# Patient Record
Sex: Female | Born: 1975 | Race: Black or African American | Hispanic: No | Marital: Single | Smoking: Current every day smoker
Health system: Southern US, Community
[De-identification: ages and names within clinical notes are randomized; demographics above are authoritative.]

## PROBLEM LIST (undated history)

## (undated) DIAGNOSIS — I10 Essential (primary) hypertension: Secondary | ICD-10-CM

---

## 2017-12-14 ENCOUNTER — Other Ambulatory Visit: Payer: Self-pay

## 2017-12-24 ENCOUNTER — Inpatient Hospital Stay (HOSPITAL_COMMUNITY): Payer: Medicaid - Out of State

## 2017-12-24 ENCOUNTER — Encounter (HOSPITAL_COMMUNITY): Payer: Self-pay | Admitting: Emergency Medicine

## 2017-12-24 ENCOUNTER — Emergency Department (HOSPITAL_COMMUNITY): Payer: Medicaid - Out of State

## 2017-12-24 ENCOUNTER — Inpatient Hospital Stay (HOSPITAL_COMMUNITY)
Admission: EM | Admit: 2017-12-24 | Discharge: 2017-12-28 | DRG: 195 | Disposition: A | Discharge status: Home / Self Care | Payer: Medicaid - Out of State | Attending: Family Medicine | Admitting: Family Medicine

## 2017-12-24 ENCOUNTER — Other Ambulatory Visit: Payer: Self-pay

## 2017-12-24 DIAGNOSIS — Z91128 Patient's intentional underdosing of medication regimen for other reason: Secondary | ICD-10-CM | POA: Diagnosis not present

## 2017-12-24 DIAGNOSIS — T50906A Underdosing of unspecified drugs, medicaments and biological substances, initial encounter: Secondary | ICD-10-CM | POA: Diagnosis present

## 2017-12-24 DIAGNOSIS — K5900 Constipation, unspecified: Secondary | ICD-10-CM | POA: Diagnosis present

## 2017-12-24 DIAGNOSIS — F17210 Nicotine dependence, cigarettes, uncomplicated: Secondary | ICD-10-CM | POA: Diagnosis present

## 2017-12-24 DIAGNOSIS — Z8619 Personal history of other infectious and parasitic diseases: Secondary | ICD-10-CM

## 2017-12-24 DIAGNOSIS — R040 Epistaxis: Secondary | ICD-10-CM | POA: Diagnosis present

## 2017-12-24 DIAGNOSIS — R079 Chest pain, unspecified: Secondary | ICD-10-CM | POA: Diagnosis not present

## 2017-12-24 DIAGNOSIS — I10 Essential (primary) hypertension: Secondary | ICD-10-CM | POA: Diagnosis present

## 2017-12-24 DIAGNOSIS — J181 Lobar pneumonia, unspecified organism: Principal | ICD-10-CM

## 2017-12-24 DIAGNOSIS — E876 Hypokalemia: Secondary | ICD-10-CM | POA: Diagnosis present

## 2017-12-24 DIAGNOSIS — I4581 Long QT syndrome: Secondary | ICD-10-CM | POA: Diagnosis present

## 2017-12-24 DIAGNOSIS — T464X6A Underdosing of angiotensin-converting-enzyme inhibitors, initial encounter: Secondary | ICD-10-CM | POA: Diagnosis present

## 2017-12-24 DIAGNOSIS — J189 Pneumonia, unspecified organism: Secondary | ICD-10-CM

## 2017-12-24 DIAGNOSIS — Z8249 Family history of ischemic heart disease and other diseases of the circulatory system: Secondary | ICD-10-CM

## 2017-12-24 HISTORY — DX: Essential (primary) hypertension: I10

## 2017-12-24 LAB — CBC
HCT: 41.7 % (ref 36.0–46.0)
HCT: 39.3 % (ref 36.0–46.0)
HEMOGLOBIN: 13.8 g/dL (ref 12.0–15.0)
Hemoglobin: 13.2 g/dL (ref 12.0–15.0)
MCH: 27.7 pg (ref 26.0–34.0)
MCH: 28.4 pg (ref 26.0–34.0)
MCHC: 33.1 g/dL (ref 30.0–36.0)
MCHC: 33.6 g/dL (ref 30.0–36.0)
MCV: 83.7 fL (ref 78.0–100.0)
MCV: 84.5 fL (ref 78.0–100.0)
PLATELETS: 223 10*3/uL (ref 150–400)
Platelets: 214 10*3/uL (ref 150–400)
RBC: 4.98 MIL/uL (ref 3.87–5.11)
RBC: 4.65 MIL/uL (ref 3.87–5.11)
RDW: 14.3 % (ref 11.5–15.5)
RDW: 14.6 % (ref 11.5–15.5)
WBC: 11.2 10*3/uL — AB (ref 4.0–10.5)
WBC: 10.4 10*3/uL (ref 4.0–10.5)

## 2017-12-24 LAB — URINALYSIS, ROUTINE W REFLEX MICROSCOPIC
GLUCOSE, UA: NEGATIVE mg/dL
HGB URINE DIPSTICK: NEGATIVE
Ketones, ur: 20 mg/dL — AB
LEUKOCYTES UA: NEGATIVE
NITRITE: NEGATIVE
PH: 5 (ref 5.0–8.0)
Protein, ur: 100 mg/dL — AB
SPECIFIC GRAVITY, URINE: 1.045 — AB (ref 1.005–1.030)

## 2017-12-24 LAB — BASIC METABOLIC PANEL
ANION GAP: 15 (ref 5–15)
BUN: 12 mg/dL (ref 6–20)
CO2: 20 mmol/L — AB (ref 22–32)
CREATININE: 0.9 mg/dL (ref 0.44–1.00)
Calcium: 9.2 mg/dL (ref 8.9–10.3)
Chloride: 101 mmol/L (ref 101–111)
GFR calc non Af Amer: >60 mL/min (ref >60)
Glucose, Bld: 133 mg/dL — ABNORMAL HIGH (ref 65–99)
Potassium: 3.4 mmol/L — ABNORMAL LOW (ref 3.5–5.1)
SODIUM: 136 mmol/L (ref 135–145)

## 2017-12-24 LAB — PROTIME-INR
INR: 1.06
Prothrombin Time: 13.7 seconds (ref 11.4–15.2)

## 2017-12-24 LAB — I-STAT TROPONIN, ED: TROPONIN I, POC: 0 ng/mL (ref 0.00–0.08)

## 2017-12-24 LAB — HEPATIC FUNCTION PANEL
ALT: 17 U/L (ref 14–54)
AST: 25 U/L (ref 15–41)
Albumin: 3.5 g/dL (ref 3.5–5.0)
Alkaline Phosphatase: 80 U/L (ref 38–126)
BILIRUBIN DIRECT: 0.2 mg/dL (ref 0.1–0.5)
Indirect Bilirubin: 0.4 mg/dL (ref 0.3–0.9)
Total Bilirubin: 0.6 mg/dL (ref 0.3–1.2)
Total Protein: 8.5 g/dL — ABNORMAL HIGH (ref 6.5–8.1)

## 2017-12-24 LAB — I-STAT CG4 LACTIC ACID, ED
LACTIC ACID, VENOUS: 1.34 mmol/L (ref 0.5–1.9)
Lactic Acid, Venous: 1.52 mmol/L (ref 0.5–1.9)

## 2017-12-24 LAB — TROPONIN I
Troponin I: <0.03 ng/mL (ref <0.03)
Troponin I: <0.03 ng/mL (ref <0.03)

## 2017-12-24 LAB — LIPASE, BLOOD: LIPASE: 41 U/L (ref 11–51)

## 2017-12-24 LAB — GLUCOSE, CAPILLARY: Glucose-Capillary: 114 mg/dL — ABNORMAL HIGH (ref 65–99)

## 2017-12-24 LAB — PREGNANCY, URINE: Preg Test, Ur: NEGATIVE

## 2017-12-24 LAB — STREP PNEUMONIAE URINARY ANTIGEN: Strep Pneumo Urinary Antigen: NEGATIVE

## 2017-12-24 LAB — APTT: APTT: 37 s — AB (ref 24–36)

## 2017-12-24 MED ORDER — VITAMIN B-1 100 MG PO TABS
100.0000 mg | ORAL_TABLET | Freq: Every day | ORAL | Status: DC
  Administered 2017-12-25 – 2017-12-28 (×4): 100 mg via ORAL
  Filled 2017-12-24 (×4): qty 1

## 2017-12-24 MED ORDER — BENZONATATE 100 MG PO CAPS
100.0000 mg | ORAL_CAPSULE | Freq: Two times a day (BID) | ORAL | Status: DC | PRN
  Administered 2017-12-25 – 2017-12-26 (×4): 100 mg via ORAL
  Filled 2017-12-24 (×4): qty 1

## 2017-12-24 MED ORDER — KETOROLAC TROMETHAMINE 30 MG/ML IJ SOLN: 30.0000 mg | Freq: Once | INTRAMUSCULAR | Status: DC

## 2017-12-24 MED ORDER — TRAMADOL HCL 50 MG PO TABS
50.0000 mg | ORAL_TABLET | Freq: Four times a day (QID) | ORAL | Status: DC | PRN
  Administered 2017-12-24 – 2017-12-28 (×9): 50 mg via ORAL
  Filled 2017-12-24 (×11): qty 1

## 2017-12-24 MED ORDER — FOLIC ACID 1 MG PO TABS
1.0000 mg | ORAL_TABLET | Freq: Every day | ORAL | Status: DC
  Administered 2017-12-25 – 2017-12-28 (×4): 1 mg via ORAL
  Filled 2017-12-24 (×4): qty 1

## 2017-12-24 MED ORDER — SODIUM CHLORIDE 0.9 % IV BOLUS
2000.0000 mL | Freq: Once | INTRAVENOUS | Status: AC
Start: 2017-12-24 — End: 2017-12-24
  Administered 2017-12-24: 2000 mL via INTRAVENOUS

## 2017-12-24 MED ORDER — SODIUM CHLORIDE 0.9 % IV SOLN: INTRAVENOUS | Status: DC

## 2017-12-24 MED ORDER — ACETAMINOPHEN 325 MG PO TABS
650.0000 mg | ORAL_TABLET | Freq: Four times a day (QID) | ORAL | Status: DC | PRN
Start: 2017-12-24 — End: 2017-12-28
  Administered 2017-12-24 – 2017-12-26 (×2): 650 mg via ORAL
  Filled 2017-12-24 (×2): qty 2

## 2017-12-24 MED ORDER — GI COCKTAIL ~~LOC~~
30.0000 mL | Freq: Once | ORAL | Status: AC
  Administered 2017-12-24: 30 mL via ORAL
  Filled 2017-12-24: qty 30

## 2017-12-24 MED ORDER — MORPHINE SULFATE (PF) 4 MG/ML IV SOLN
1.0000 mg | Freq: Once | INTRAVENOUS | Status: AC
  Administered 2017-12-24: 1 mg via INTRAVENOUS
  Filled 2017-12-24: qty 1

## 2017-12-24 MED ORDER — IOPAMIDOL (ISOVUE-300) INJECTION 61%: 100.0000 mL | Freq: Once | INTRAVENOUS | Status: DC | PRN

## 2017-12-24 MED ORDER — ONDANSETRON HCL 40 MG/20ML IJ SOLN
8.0000 mg | Freq: Once | INTRAMUSCULAR | Status: DC
  Filled 2017-12-24: qty 4

## 2017-12-24 MED ORDER — POLYETHYLENE GLYCOL 3350 17 G PO PACK: 17.0000 g | PACK | Freq: Every day | ORAL | Status: DC | PRN

## 2017-12-24 MED ORDER — ACETAMINOPHEN 325 MG PO TABS
650.0000 mg | ORAL_TABLET | Freq: Four times a day (QID) | ORAL | Status: DC | PRN
  Administered 2017-12-24: 650 mg via ORAL
  Filled 2017-12-24: qty 2

## 2017-12-24 MED ORDER — SODIUM CHLORIDE 0.9 % IV SOLN
INTRAVENOUS | Status: DC
  Administered 2017-12-24 – 2017-12-26 (×6): via INTRAVENOUS

## 2017-12-24 MED ORDER — SODIUM CHLORIDE 0.9 % IV SOLN
8.0000 mg | Freq: Three times a day (TID) | INTRAVENOUS | Status: DC
  Filled 2017-12-24: qty 4

## 2017-12-24 MED ORDER — SODIUM CHLORIDE 0.9 % IV SOLN
2.0000 g | INTRAVENOUS | Status: DC
  Administered 2017-12-24 – 2017-12-26 (×3): 2 g via INTRAVENOUS
  Filled 2017-12-24 (×4): qty 20

## 2017-12-24 MED ORDER — IOPAMIDOL (ISOVUE-370) INJECTION 76%
INTRAVENOUS | Status: AC
  Filled 2017-12-24: qty 100

## 2017-12-24 MED ORDER — IOPAMIDOL (ISOVUE-370) INJECTION 76%
100.0000 mL | Freq: Once | INTRAVENOUS | Status: AC | PRN
  Administered 2017-12-24: 100 mL via INTRAVENOUS

## 2017-12-24 MED ORDER — NICOTINE 7 MG/24HR TD PT24
7.0000 mg | MEDICATED_PATCH | Freq: Every day | TRANSDERMAL | Status: DC
  Administered 2017-12-24 – 2017-12-28 (×5): 7 mg via TRANSDERMAL
  Filled 2017-12-24 (×5): qty 1

## 2017-12-24 MED ORDER — PROMETHAZINE HCL 25 MG PO TABS
25.0000 mg | ORAL_TABLET | Freq: Four times a day (QID) | ORAL | Status: DC | PRN
Start: 2017-12-24 — End: 2017-12-28

## 2017-12-24 MED ORDER — ENOXAPARIN SODIUM 40 MG/0.4ML ~~LOC~~ SOLN
40.0000 mg | SUBCUTANEOUS | Status: DC
  Filled 2017-12-24: qty 0.4

## 2017-12-24 MED ORDER — NITROGLYCERIN 0.4 MG SL SUBL
0.4000 mg | SUBLINGUAL_TABLET | SUBLINGUAL | Status: DC | PRN
  Administered 2017-12-24: 0.4 mg via SUBLINGUAL
  Filled 2017-12-24: qty 1

## 2017-12-24 MED ORDER — HYDRALAZINE HCL 20 MG/ML IJ SOLN: 2.0000 mg | INTRAMUSCULAR | Status: DC | PRN

## 2017-12-24 MED ORDER — ALBUTEROL SULFATE (2.5 MG/3ML) 0.083% IN NEBU
2.5000 mg | INHALATION_SOLUTION | RESPIRATORY_TRACT | Status: DC | PRN
  Administered 2017-12-24 – 2017-12-25 (×2): 2.5 mg via RESPIRATORY_TRACT
  Filled 2017-12-24 (×2): qty 3

## 2017-12-24 MED ORDER — IBUPROFEN 600 MG PO TABS
600.0000 mg | ORAL_TABLET | Freq: Four times a day (QID) | ORAL | Status: DC | PRN
  Administered 2017-12-25 – 2017-12-28 (×5): 600 mg via ORAL
  Filled 2017-12-24 (×5): qty 1

## 2017-12-24 MED ORDER — ADULT MULTIVITAMIN W/MINERALS CH
1.0000 | ORAL_TABLET | Freq: Every day | ORAL | Status: DC
  Administered 2017-12-25 – 2017-12-28 (×4): 1 via ORAL
  Filled 2017-12-24 (×4): qty 1

## 2017-12-24 MED ORDER — SODIUM CHLORIDE 0.9 % IV SOLN
500.0000 mg | INTRAVENOUS | Status: DC
  Administered 2017-12-24 – 2017-12-26 (×3): 500 mg via INTRAVENOUS
  Filled 2017-12-24 (×4): qty 500

## 2017-12-24 NOTE — ED Notes (Signed)
Pt transported to CT ?

## 2017-12-24 NOTE — Plan of Care (Signed)
  Problem: Health Behavior/Discharge Planning: Goal: Ability to manage health-related needs will improve Outcome: Progressing   Problem: Self-Concept: Goal: Level of anxiety will decrease Outcome: Progressing   Problem: Clinical Measurements: Goal: Will remain free from infection Outcome: Not Progressing   Problem: Health Behavior/Discharge Planning: Goal: Ability to manage health-related needs will improve 12/24/2017 2044 by Larey BrickEjindu, Derril Franek O, RN Outcome: Progressing 12/24/2017 2043 by Larey BrickEjindu, Radonna Bracher O, RN Outcome: Progressing

## 2017-12-24 NOTE — H&P (Addendum)
[facility] History and Physical Service Pager: [phone]  Patient name: Sally Vaughan Medical record number: [id number] Date of birth: 08/07/1976 Age: 42 y.o. Gender: female  Primary Care Provider: No primary care provider on file. Consultants: None Code Status: Full  Chief Complaint: Chest pain and cough  Assessment and Plan: Sally Vaughan is a 42 y.o. female presenting with 3 days of chest pain and cough. PMH is significant for HTN, Syphilis, alcohol use  Chest pain with Cough secondary to RUL/RML/RLL PNA confirmed on CTA chest. 3 days of worsening CP and cough. Febrile 101.14F, tachycardic 130s, tachypnic 30s. Stable on RA. LA wnl. Mild leukocytosis of 11.2 CXR showed RML infiltrate consistent with PNA. EKG Sinus tachycardia, no ST changes. Prolonged QTc 562. Istat troponin was 0.00. Data most consistent with Community Acquired Pneumonia. Also on the differential is ACS, PE given recent travel history. Due to notable discomfort in the ED also considered aortic dissection however PE and dissection were ruled on CTA chest. Patient does not appear fluid overloaded and CXR not supportive of CHF.  -admit to telemetry, attending Dr. Pollie MeyerMcIntyre -cont IV CTX and Azythromycin in ED, narrow and transition to PO when afebrile and tolerating PO - monitor blood cultures - monitor fever curve - AM bmp and cbc - trend troponins - AM EKG - cardiac monitoring - continuous pulse oximetry - avoid NSAIDS until troponin negative x3 in setting of chest pain - tyleonol for pain - Radiology recommends Followup PA and lateral chest X-ray is recommended in 3-4 weeks following trial of antibiotic therapy to ensure resolution and exclude underlying Malignancy. - check LFTs - 1 mg IV morphine for pain x1  Abdominal Pain with Nausea- in the setting of established pneumonia. Most likely due to her acute illness. Patient vomited x2 in room. NBNB. Diffuse abdominal pain Possible  pain related due to PNA. Eyes are icteric and has hyperbilirubinuria. H/o irregular periods, last 1 mo ago.  - urine pregnancy negative - Will get LFTs, lipase to rule out pancreatitis  - GI cocktail  - Phenergan PO q6h prn - avoiding zofran due to prolonged QTc  Epistaxis  Has only experience it in the past day. Reports 6 episodes. No history of bleeding disorders or family history. Has never had this before. Only associated with mild HA. Normal platelets.   - monitor CBC - check PT/INR, PTT  Hypertension Hypertensive on admission 159/109. Has not been on regular BP medication. Pt says she previously took lisinopril, but does not know dose.  - monitor BP - prn hydralazine for > 160/100 - consider discharged on maintenance therapy when tolerating PO better  H/o Syphilis with uvietis Patient reports h/o syphilis with treatment with IV antibiotics. She also reports having the complication of Uvietis. Says her vision is intact. On exam eyes appear icteric with mild blue sclera, no injection or erythema observed.   H/o of alcohol use -CIWA monitoring, no ativan prn  H/o smoking -nicotine patch -nurse to provide smoking cessation resources  Prolonged QTc - noted to be 562 on admission EKG - avoid QTc prolonging meds  FEN/GI: HHD, mIVF  Prophylaxis: LMWH  Disposition: Inpatient admission for IV antibiotics and telemetry monitoring  History of Present Illness:  Sally Vaughan is a 42 y.o. female smoker presenting with 3 days of worsening cough, chest pain. Patient reports she was in her usual state of health until 3 days ago when she developed chest pain and cough. This morning her pain was acutely worse  and woke her from sleep, prompting her to come into the emergency department. Chest pain is worse with activity and with coughing, has not been relieved with rest. She denies diaphoresis. Also has 2 days of bloody nose, abdominal pain, and vomiting x2 in ER. Chest pain is of 9/10 and is  constant, but worse in upper middle- chest anterior chest that radiates bilaterally to posterior bilateral side. She has never had any thing like this before. She did not have any measured fever at home. Patient tried tylenol, but it was not helping. Patient CP was so severe today that it work her up today so that she came to the ED.    In the ED, patient was tachycardic and tachypnic. patient received 2L NS bolus and was placed on IV CTX and azithromycin. CXR was notable for RML PNA.  Review Of Systems: Per HPI with the following additions:   Review of Systems  Constitutional: Positive for chills.  HENT: Positive for congestion, nosebleeds and sinus pain.   Eyes: Positive for redness. Negative for blurred vision.       "Red, bloodshot eyes" are chronic  Respiratory: Positive for cough, hemoptysis, sputum production and shortness of breath. Negative for wheezing.   Cardiovascular: Positive for chest pain and palpitations. Negative for leg swelling.  Gastrointestinal: Positive for abdominal pain, constipation, nausea and vomiting. Negative for diarrhea.  Genitourinary: Negative for dysuria, flank pain, frequency, hematuria and urgency.       Denies any vaginal discharge, vaginal sores, itching or pain  Skin: Negative for rash.  Neurological: Positive for headaches. Negative for dizziness.  Endo/Heme/Allergies: Does not bruise/bleed easily.   Past Medical History: Past Medical History:  Diagnosis Date  . Hypertension   Denies history of heart attack, DM  Past Surgical History: History reviewed. No pertinent surgical history.  Social History: Social History   Tobacco Use  . Smoking status: Current Every Day Smoker    Packs/day: 0.25  . Smokeless tobacco: Never Used  Substance Use Topics  . Alcohol use: Not on file  . Drug use: Never   Additional social history: Smokes 1 pack ever 3 day, drinks 40 oz beer every two days, Marijuana - last used 2 weeks ago - avg 2 joints, no other  drugs, no iv drug use   Family History: No family h/o blood disorders Live in [place], currently unemployed,  Please also refer to relevant sections of EMR. Mother has hypertension. Father died in car crash.   Allergies and Medications: No Known Allergies No current facility-administered medications on file prior to encounter.    No current outpatient medications on file prior to encounter.    Objective: BP (!) 132/100   Pulse (!) 115   Temp (S) 100.3 F (37.9 C)   Resp (!) 23   Ht 5' (1.524 m)   Wt 160 lb (72.6 kg)   LMP 11/12/2017 (Exact Date)   SpO2 95%   BMI 31.25 kg/m  Exam: Gen: lying in bed, cannot get comfortable due to chest pain, appears very uncomfortable HEENT: no cervical lymphadenopathy, +mild congestion, EOMI, PERRL, eyes appear icteric, with small areas with mild blue sclera CV: tachycardic with no murmurs appreciated, chest tender to palpation Pulm: tachypnic, decrease breath sound on and rhonchi right lower lung fields GI: Soft, diffusely tender to palpation, large central pannis MSK: no edema, cyanosis, or clubbing noted Skin: warm, dry Neuro: grossly normal, moves all extremities Psych: Normal affect and thought content   Labs and Imaging: CBC BMET  Recent Labs  Lab 12/24/17 1153  WBC 11.2*  HGB 13.8  HCT 41.7  PLT 223   Recent Labs  Lab 12/24/17 1153  NA 136  K 3.4*  CL 101  CO2 20*  BUN 12  CREATININE 0.90  GLUCOSE 133*  CALCIUM 9.2     Troponin neg x1 LA - 1.52 UA - moderate bilirubin, 100 protein, high specific gravity, rare bacteria Blood cultures x pending  Dg Chest 2 View  Result Date: 12/24/2017 CLINICAL DATA:  Shortness of breath.  Shortness of breath. EXAM: CHEST - 2 VIEW COMPARISON:  No prior. FINDINGS: Mediastinum hilar structures normal. Mild right middle lobe infiltrate suggesting pneumonia. No pleural effusion or pneumothorax. Heart size normal. Thoracic spine scoliosis. IMPRESSION: Mild right middle lobe  infiltrate consistent pneumonia. Followup PA and lateral chest X-ray is recommended in 3-4 weeks following trial of antibiotic therapy to ensure resolution and exclude underlying malignancy. Electronically Signed   By: Maisie Fus  Register   On: 12/24/2017 12:04   Garnette Gunner, MD 12/24/2017, 1:47 PM PGY-1, [facility] FPTS Intern pager: [phone], text pages welcome  I have separately seen and examined the patient. I have discussed the findings and exam with Dr. Janee Morn and agree with the above note in its edited form.  My changes/additions are outlined in BLUE.   Howard Pouch, MD PGY-2 [facility]

## 2017-12-24 NOTE — Progress Notes (Signed)
Patient from eD with CAP. Coughing , Increased HR and pain. MD notified

## 2017-12-24 NOTE — ED Notes (Signed)
Heart healthy dinner tray ordered 

## 2017-12-24 NOTE — Progress Notes (Signed)
Provider on call notified times 2 for patient increased coughing , HR and elevated temp.

## 2017-12-24 NOTE — ED Triage Notes (Signed)
Patient complains of shortness of breath and 6 episodes of epistaxis since yesterday.  History of hypertension, states she has not taken her blood pressure medicine in five months because she cannot afford it.

## 2017-12-24 NOTE — ED Provider Notes (Signed)
[facility] EMERGENCY DEPARTMENT Provider Note   CSN: [id number] Arrival date & time: 12/24/17  1134     History   Chief Complaint Chief Complaint  Patient presents with  . Shortness of Breath    HPI Sally Vaughan is a 42 y.o. female.  42 year old female presents with 48 hours of increasing cough and dyspnea on exertion.  Cough is been productive of green-yellow sputum.  Has had subjective fever and chills.  No vomiting or diarrhea.  No anginal type chest pain.  Has had intermittent epistaxis but none currently.  Does note URI symptoms but denies any ear pain or sore throat.  Denies any dysuria.  She is also been noncompliant with her blood pressure medication.  Symptoms persistent and worse with exertion better with rest.  No treatment used prior to arrival.     Past Medical History:  Diagnosis Date  . Hypertension     There are no active problems to display for this patient.   History reviewed. No pertinent surgical history.   OB History   None      Home Medications    Prior to Admission medications   Not on File    Family History No family history on file.  Social History Social History   Tobacco Use  . Smoking status: Current Every Day Smoker    Packs/day: 0.25  . Smokeless tobacco: Never Used  Substance Use Topics  . Alcohol use: Not on file  . Drug use: Never     Allergies   Patient has no known allergies.   Review of Systems Review of Systems  All other systems reviewed and are negative.    Physical Exam Updated Vital Signs BP (!) 135/107 (BP Location: Right Arm)   Pulse (!) 128   Temp 99.2 F (37.3 C) (Oral)   Resp 16   Ht 1.524 m (5')   Wt 72.6 kg (160 lb)   LMP 11/12/2017 (Exact Date)   SpO2 95%   BMI 31.25 kg/m   Physical Exam  Constitutional: She is oriented to person, place, and time. She appears well-developed and well-nourished.  Non-toxic appearance. No distress.  HENT:  Head: Normocephalic and  atraumatic.  Eyes: Pupils are equal, round, and reactive to light. Conjunctivae, EOM and lids are normal.  Neck: Normal range of motion. Neck supple. No tracheal deviation present. No thyroid mass present.  Cardiovascular: Regular rhythm and normal heart sounds. Tachycardia present. Exam reveals no gallop.  No murmur heard. Pulmonary/Chest: Effort normal. No stridor. No respiratory distress. She has decreased breath sounds in the right lower field and the left lower field. She has no wheezes. She has rhonchi in the right lower field and the left lower field. She has no rales.  Abdominal: Soft. Normal appearance and bowel sounds are normal. She exhibits no distension. There is no tenderness. There is no rebound and no CVA tenderness.  Musculoskeletal: Normal range of motion. She exhibits no edema or tenderness.  Neurological: She is alert and oriented to person, place, and time. She has normal strength. No cranial nerve deficit or sensory deficit. GCS eye subscore is 4. GCS verbal subscore is 5. GCS motor subscore is 6.  Skin: Skin is warm and dry. No abrasion and no rash noted.  Psychiatric: She has a normal mood and affect. Her speech is normal and behavior is normal.  Nursing note and vitals reviewed.    ED Treatments / Results  Labs (all labs ordered are listed, but only  abnormal results are displayed) Labs Reviewed  CBC - Abnormal; Notable for the following components:      Result Value   WBC 11.2 (*)    All other components within normal limits  CULTURE, BLOOD (ROUTINE X 2)  CULTURE, BLOOD (ROUTINE X 2)  BASIC METABOLIC PANEL  URINALYSIS, ROUTINE W REFLEX MICROSCOPIC  I-STAT TROPONIN, ED  I-STAT CG4 LACTIC ACID, ED    EKG None  Radiology Dg Chest 2 View  Result Date: 12/24/2017 CLINICAL DATA:  Shortness of breath.  Shortness of breath. EXAM: CHEST - 2 VIEW COMPARISON:  No prior. FINDINGS: Mediastinum hilar structures normal. Mild right middle lobe infiltrate suggesting  pneumonia. No pleural effusion or pneumothorax. Heart size normal. Thoracic spine scoliosis. IMPRESSION: Mild right middle lobe infiltrate consistent pneumonia. Followup PA and lateral chest X-ray is recommended in 3-4 weeks following trial of antibiotic therapy to ensure resolution and exclude underlying malignancy. Electronically Signed   By: Maisie Fushomas  Register   On: 12/24/2017 12:04    Procedures Procedures (including critical care time)  Medications Ordered in ED Medications  cefTRIAXone (ROCEPHIN) 2 g in sodium chloride 0.9 % 100 mL IVPB (has no administration in time range)  azithromycin (ZITHROMAX) 500 mg in sodium chloride 0.9 % 250 mL IVPB (has no administration in time range)     Initial Impression / Assessment and Plan / ED Course  I have reviewed the triage vital signs and the nursing notes.  Pertinent labs & imaging results that were available during my care of the patient were reviewed by me and considered in my medical decision making (see chart for details).     Patient with evidence of pneumonia on chest x-ray.  She is tachycardic here.  Also tachypneic as well.  Start on IV antibiotics and will be admitted to the medicine service  Final Clinical Impressions(s) / ED Diagnoses   Final diagnoses:  None    ED Discharge Orders    None       Lorre NickAllen, Erilyn Pearman, MD 12/24/17 1323

## 2017-12-25 ENCOUNTER — Other Ambulatory Visit: Payer: Self-pay

## 2017-12-25 LAB — CBC
HCT: 34.6 % — ABNORMAL LOW (ref 36.0–46.0)
Hemoglobin: 11.3 g/dL — ABNORMAL LOW (ref 12.0–15.0)
MCH: 27.2 pg (ref 26.0–34.0)
MCHC: 32.7 g/dL (ref 30.0–36.0)
MCV: 83.4 fL (ref 78.0–100.0)
PLATELETS: 177 10*3/uL (ref 150–400)
RBC: 4.15 MIL/uL (ref 3.87–5.11)
RDW: 14.2 % (ref 11.5–15.5)
WBC: 9.1 10*3/uL (ref 4.0–10.5)

## 2017-12-25 LAB — BASIC METABOLIC PANEL
Anion gap: 12 (ref 5–15)
CO2: 20 mmol/L — ABNORMAL LOW (ref 22–32)
CREATININE: 0.59 mg/dL (ref 0.44–1.00)
Calcium: 8.1 mg/dL — ABNORMAL LOW (ref 8.9–10.3)
Chloride: 103 mmol/L (ref 101–111)
GFR calc Af Amer: >60 mL/min (ref >60)
Glucose, Bld: 117 mg/dL — ABNORMAL HIGH (ref 65–99)
Potassium: 2.9 mmol/L — ABNORMAL LOW (ref 3.5–5.1)
SODIUM: 135 mmol/L (ref 135–145)

## 2017-12-25 LAB — TROPONIN I

## 2017-12-25 LAB — HIV ANTIBODY (ROUTINE TESTING W REFLEX): HIV SCREEN 4TH GENERATION: NONREACTIVE

## 2017-12-25 MED ORDER — MORPHINE SULFATE (PF) 2 MG/ML IV SOLN
1.0000 mg | Freq: Once | INTRAVENOUS | Status: AC
  Administered 2017-12-25: 1 mg via INTRAVENOUS
  Filled 2017-12-25: qty 1

## 2017-12-25 MED ORDER — GUAIFENESIN-DM 100-10 MG/5ML PO SYRP
5.0000 mL | ORAL_SOLUTION | ORAL | Status: DC | PRN
  Administered 2017-12-25 – 2017-12-26 (×3): 5 mL via ORAL
  Filled 2017-12-25 (×3): qty 5

## 2017-12-25 MED ORDER — POTASSIUM CHLORIDE CRYS ER 20 MEQ PO TBCR
40.0000 meq | EXTENDED_RELEASE_TABLET | Freq: Two times a day (BID) | ORAL | Status: AC
  Administered 2017-12-25 (×2): 40 meq via ORAL
  Filled 2017-12-25 (×2): qty 2

## 2017-12-25 MED ORDER — LABETALOL HCL 5 MG/ML IV SOLN
5.0000 mg | INTRAVENOUS | Status: DC | PRN
  Administered 2017-12-25 – 2017-12-27 (×5): 5 mg via INTRAVENOUS
  Filled 2017-12-25 (×6): qty 4

## 2017-12-25 MED ORDER — MORPHINE SULFATE (PF) 2 MG/ML IV SOLN
1.0000 mg | INTRAVENOUS | Status: DC | PRN
Start: 2017-12-25 — End: 2017-12-27

## 2017-12-25 NOTE — Progress Notes (Signed)
Family Medicine Teaching Service Daily Progress Note Intern Pager: [phone]  Patient name: Sally Vaughan Medical record number: [id number] Date of birth: 12-03-1975 Age: 42 y.o. Gender: female  Primary Care Provider: No primary care provider on file. Consultants: None Code Status: Full  Pt Overview and Major Events to Date:  4/12 - admit to FPTS   Assessment and Plan: Gianina Dolezal is a 42 y.o. female presenting with 3 days of chest pain and cough, found to have PNA. PMH is significant for HTN, Syphilis, alcohol use  CAP - RUL, RML, RLL PNA noted on CTA. PE and dissection ruled out with CTA. Troponins negative x2 and EKG sinus tachycardia without ST-T changes to suggest ischemia. No evidence of CHF on CXR or exam. Patient febrile overnight and persistently tachycardic despite IV antibiotics to cover CAP and mIVF. Overnight, added Ibuprofen and tramadol PRN chest wall/pleuritic pain, tylenol PRN fevers, and tessalon PRN cough.  Additionally required a second dose of 1 mg morphine x1 this AM for chest discomfort. - Continue day #2 of Azithromycin and ceftriaxone (4/12 - ) - monitor blood cultures - monitor fever curve - cardiac monitoring - continuous pulse oximetry - continue tylenol, ibuprofen, tramadol for pain - albuterol PRN - follow up third troponin  - strep pneumo uAg neg, legionella pending - Radiology recommends Followup PA and lateral chest X-ray is recommended in 3-4 weeks following trial of antibiotic therapy to ensure resolution and exclude underlying malignancy.  Persistent tachycardia - seems to be in the setting of fever and infection. Also patient is in considerable pain and her tachycardia improved with morphine x1 when she became more comfortable. Troponin negative x2. AM EKG Sinus tachycardia, unchanged from previous. Patient is additionally hypertensive at 140/107. CIWA score 3. - monitor on telemetry - continue fluids, tylenol PRN fever, abx as above - labetalol PRN  elevated pressures - continue to monitor on CIWA as noted below  Abdominal Pain with Nausea - in the setting of established pneumonia. Most likely due to her acute illness. LFT's, lipase WNL.   - urine pregnancy negative - GI cocktail PRN - Phenergan PO q6h prn - avoiding zofran due to prolonged QTc - monitor on CIWA  Epistaxis  Has only experience it in the past day prior to admit. Reports 6 episodes. No history of bleeding disorders or family history. Has never had this before. Only associated with mild HA. Normal platelets, PT/INR. - monitor CBC  Hypertension Hypertensive this AM at 140/107. Has not been on regular BP medication. Pt says she previously took lisinopril, but does not know dose.  - monitor BP - prn labetalol for elevated BP - consider discharged on maintenance therapy when tolerating PO better  H/o Syphilis with uvietis Patient reports h/o syphilis with treatment with IV antibiotics. She also reports having the complication of Uvietis. Says her vision is intact. On exam eyes appear icteric with mild blue sclera, no injection or erythema observed.   H/o of alcohol use - CIWA monitoring, no ativan prn  H/o smoking -nicotine patch -nurse to provide smoking cessation resources  Prolonged QTc - noted to be 562 on admission EKG - avoid QTc prolonging meds  FEN/GI: HHD, mIVF  Prophylaxis: LMWH  Disposition: Continue to monitor and treat in the hospital for ongoing illness, anticipate 2-3 more days of admission  Subjective:  Patient was persistently tachycardic with fevers and pain overnight. Continues to sat well on room air. She received 1 mg morphine this AM and feels this improved her pain  significantly. She continues to have cough.  Objective: Temp:  [99.2 F (37.3 C)-101.5 F (38.6 C)] 99.5 F (37.5 C) (04/13 0400) Pulse Rate:  [108-129] 121 (04/13 0400) Resp:  [16-34] 30 (04/13 0400) BP: (125-159)/(100-115) 140/107 (04/13 0400) SpO2:  [93 %-100 %]  93 % (04/13 0400) Weight:  [160 lb (72.6 kg)-165 lb 2 oz (74.9 kg)] 165 lb 2 oz (74.9 kg) (04/12 2009) Physical Exam: General: No distress, nontoxic appearing, mildly uncomfortable in the bed Cardiovascular: tachycardia with regular rhythm, no m/r/g Respiratory: coarse breath sounds throughout without wheezing, moves air through all lung fields, comfortable work of breathing. Abdomen: soft, nt, nd Extremities: no LE edema  Laboratory: Recent Labs  Lab 12/24/17 1153 12/24/17 1612  WBC 11.2* 10.4  HGB 13.8 13.2  HCT 41.7 39.3  PLT 223 214   Recent Labs  Lab 12/24/17 1153 12/24/17 1612  NA 136  --   K 3.4*  --   CL 101  --   CO2 20*  --   BUN 12  --   CREATININE 0.90  --   CALCIUM 9.2  --   PROT  --  8.5*  BILITOT  --  0.6  ALKPHOS  --  80  ALT  --  17  AST  --  25  GLUCOSE 133*  --    upreg neg Strep penumo uAG neg PT/INR WNL Lipase WNL  Imaging/Diagnostic Tests: Dg Chest 2 View  Result Date: 12/24/2017 CLINICAL DATA:  Shortness of breath.  Shortness of breath. EXAM: CHEST - 2 VIEW COMPARISON:  No prior. FINDINGS: Mediastinum hilar structures normal. Mild right middle lobe infiltrate suggesting pneumonia. No pleural effusion or pneumothorax. Heart size normal. Thoracic spine scoliosis. IMPRESSION: Mild right middle lobe infiltrate consistent pneumonia. Followup PA and lateral chest X-ray is recommended in 3-4 weeks following trial of antibiotic therapy to ensure resolution and exclude underlying malignancy. Electronically Signed   By: Maisie Fushomas  Register   On: 12/24/2017 12:04   Ct Angio Chest Pe W Or Wo Contrast  Result Date: 12/24/2017 CLINICAL DATA:  Three-day history of chest pain EXAM: CT ANGIOGRAPHY CHEST WITH CONTRAST TECHNIQUE: Multidetector CT imaging of the chest was performed using the standard protocol during bolus administration of intravenous contrast. Multiplanar CT image reconstructions and MIPs were obtained to evaluate the vascular anatomy. CONTRAST:  55  mL ISOVUE-370 IOPAMIDOL (ISOVUE-370) INJECTION 76% COMPARISON:  Chest radiograph December 24, 2017 FINDINGS: Cardiovascular: There is no demonstrable pulmonary embolus. There is no thoracic aortic aneurysm or dissection. The visualized great vessels appear unremarkable. There is no appreciable pericardial effusion or pericardial thickening. Mediastinum/Nodes: Visualized thyroid appears normal. There is a lymph node anterior to the carina measuring 1.4 x 1.0 cm. There is a subcarinal lymph node measuring 1.2 x 1.0 cm. There are scattered subcentimeter lymph nodes as well in the thorax. No esophageal lesions are appreciable. Lungs/Pleura: There is tree on bud type appearance in the right upper lobe involving portions of the anterior and posterior segments of the right upper lobe consistent with pneumonia. There is patchy consolidation in the right middle lobe. There is also patchy infiltrate in the superior and posterior segments of the left lower lobe. No pleural effusion or pleural thickening evident. Upper Abdomen: Visualized upper abdominal structures appear unremarkable. Musculoskeletal: No blastic or lytic bone lesions. There is upper lumbar levoscoliosis. No appreciable chest wall lesions. Review of the MIP images confirms the above findings. IMPRESSION: 1. Multifocal pneumonia with areas of pneumonia in the right upper lobe, right  middle lobe, and left lower lobe. 2. No demonstrable pulmonary embolus. No thoracic aortic aneurysm or dissection. 3. Mild adenopathy, likely reactive given the changes in the lung parenchyma. Electronically Signed   By: Bretta Bang III M.D.   On: 12/24/2017 19:14    Howard Pouch, MD 12/25/2017, 5:10 AM PGY-2, [facility] FPTS Intern pager: [phone], text pages welcome

## 2017-12-26 ENCOUNTER — Other Ambulatory Visit: Payer: Self-pay

## 2017-12-26 DIAGNOSIS — J181 Lobar pneumonia, unspecified organism: Secondary | ICD-10-CM

## 2017-12-26 DIAGNOSIS — R079 Chest pain, unspecified: Secondary | ICD-10-CM

## 2017-12-26 LAB — TSH: TSH: 1.58 u[IU]/mL (ref 0.350–4.500)

## 2017-12-26 LAB — CBC
HEMATOCRIT: 36 % (ref 36.0–46.0)
HEMOGLOBIN: 11.8 g/dL — AB (ref 12.0–15.0)
MCH: 28 pg (ref 26.0–34.0)
MCHC: 32.8 g/dL (ref 30.0–36.0)
MCV: 85.3 fL (ref 78.0–100.0)
Platelets: 194 10*3/uL (ref 150–400)
RBC: 4.22 MIL/uL (ref 3.87–5.11)
RDW: 14.7 % (ref 11.5–15.5)
WBC: 7.9 10*3/uL (ref 4.0–10.5)

## 2017-12-26 LAB — BASIC METABOLIC PANEL
Anion gap: 10 (ref 5–15)
CHLORIDE: 106 mmol/L (ref 101–111)
CO2: 22 mmol/L (ref 22–32)
CREATININE: 0.54 mg/dL (ref 0.44–1.00)
Calcium: 8.6 mg/dL — ABNORMAL LOW (ref 8.9–10.3)
GFR calc Af Amer: >60 mL/min (ref >60)
GFR calc non Af Amer: >60 mL/min (ref >60)
Glucose, Bld: 96 mg/dL (ref 65–99)
POTASSIUM: 4 mmol/L (ref 3.5–5.1)
Sodium: 138 mmol/L (ref 135–145)

## 2017-12-26 MED ORDER — IPRATROPIUM-ALBUTEROL 0.5-2.5 (3) MG/3ML IN SOLN
3.0000 mL | Freq: Four times a day (QID) | RESPIRATORY_TRACT | Status: DC
  Administered 2017-12-27 – 2017-12-28 (×6): 3 mL via RESPIRATORY_TRACT
  Filled 2017-12-26 (×6): qty 3

## 2017-12-26 MED ORDER — PREDNISONE 20 MG PO TABS
40.0000 mg | ORAL_TABLET | Freq: Every day | ORAL | Status: DC
  Administered 2017-12-26 – 2017-12-28 (×3): 40 mg via ORAL
  Filled 2017-12-26 (×3): qty 2

## 2017-12-26 MED ORDER — IPRATROPIUM-ALBUTEROL 0.5-2.5 (3) MG/3ML IN SOLN
3.0000 mL | RESPIRATORY_TRACT | Status: DC
  Administered 2017-12-26 (×3): 3 mL via RESPIRATORY_TRACT
  Filled 2017-12-26 (×2): qty 3

## 2017-12-26 MED ORDER — HYDROCHLOROTHIAZIDE 12.5 MG PO CAPS
12.5000 mg | ORAL_CAPSULE | Freq: Every day | ORAL | Status: DC
  Administered 2017-12-26: 12.5 mg via ORAL
  Filled 2017-12-26: qty 1

## 2017-12-26 NOTE — Progress Notes (Signed)
Family Medicine Teaching Service Daily Progress Note Intern Pager: [phone]  Patient name: Dalene CarrowLateal Dadamo Medical record number: [id number] Date of birth: 05/30/1976 Age: 42 y.o. Gender: female  Primary Care Provider: No primary care provider on file. Consultants: None Code Status: Full  Pt Overview and Major Events to Date:  4/12 - admit to FPTS   Assessment and Plan: Gerldine Remi DeterSamuel is a 42 y.o. female presenting with 3 days of chest pain and cough, found to have PNA. PMH is significant for HTN, Syphilis, alcohol use  Community Acquired Pneumonia, improving Right Lung PNA ( all segments) on CTA. Patient continue to be afebrile .  and tachycardia has resolved. Still endorses chest wall/ pleuritic pain but improving. Cough is also improving. Blood cultures NGTD. Strep  pneumo negative. PE and dissection ruled out with CTA. No active concern for cardiac process, trop negative x3, EKG NSR.  --Continue Azithromycin and ceftriaxone (Start 4/12) DAY #3 --Could consider transitioning to  oral agent --Duoneb schedule q4 --Prednisone 40 mg daily  --Oxygen therapy  --Continue Telemetry --Continue tylenol, ibuprofen, tramadol for pain --Continue Albuterol PRN --Robitussin and tessalon perles for cough --Legionella pending --Radiology recommends Followup PA and lateral chest X-ray is recommended in 3-4 weeks following trial of antibiotic therapy to ensure resolution and exclude underlying malignancy.  Persistent tachycardia, resolved Likely acute in the setting of infection and albuterol treatment. TSH was within normal limits. Trop neg x3 . EKG NSR. Will continue to monitor. --Continue telemetry --Continue fluids, tylenol PRN fever, abx as above --Labetalol PRN  --Continue to monitor on CIWA as noted below  Abdominal Pain with Nausea  in the setting of established pneumonia. Most likely due to her acute illness. LFT's, lipase WNL. Urine pregnancy negative --GI cocktail PRN --Phenergan PO  q6h prn - avoiding zofran due to prolonged QTc --Monitor on CIWA  Epistaxis, resolved  Hemoglobin is stable. 6 episodes in the past few days . No history of bleeding disorders or family history. Has never had this before. Only associated with mild HA. Normal platelets, PT/INR.  --Continue to monitor CBC  Hypertension BP this AM at 146/117. Has not been on regular BP medication. Pt says she previously took lisinopril, but does not know dose.  --Will start patient on HCTZ 12.5 mg daily --Labetalol for elevated BP --Will start on amlodipine 5 mg   H/o Syphilis with uveitis Patient reports h/o syphilis with treatment with IV antibiotics. She also reports having the complication of Uvietis. Says her vision is intact. On exam eyes appear icteric with mild blue sclera, no injection or erythema observed.   H/o of alcohol use - CIWA monitoring, no ativan prn  H/o smoking -nicotine patch -nurse to provide smoking cessation resources  Prolonged QTc - noted to be 562 on admission EKG - avoid QTc prolonging meds  FEN/GI: HHD, mIVF  Prophylaxis: LMWH  Disposition: Home pending clinical improvement  Subjective:  Patient feeling better a little better this morning. On [place] intermittently overnight. Still complains of chest pain.  Objective: Temp:  [98.2 F (36.8 C)-99.6 F (37.6 C)] 98.4 F (36.9 C) (04/14 0822) Pulse Rate:  [95-106] 95 (04/14 0424) Resp:  [18-25] 18 (04/14 0424) BP: (134-157)/(97-117) 146/117 (04/14 0911) SpO2:  [97 %-99 %] 98 % (04/14 13080822)   Physical Exam: General: No distress, nontoxic appearing, mildly uncomfortable in the bed Cardiovascular: tachycardia with regular rhythm, no m/r/g Respiratory: coarse breath sounds throughout mild wheezing, moves air through all lung fields, no increase work of breathing. Abdomen: soft, nt,  nd Extremities: no LE edema  Laboratory: Recent Labs  Lab 12/24/17 1612 12/25/17 0605 12/26/17 0356  WBC 10.4 9.1 7.9  HGB  13.2 11.3* 11.8*  HCT 39.3 34.6* 36.0  PLT 214 177 194   Recent Labs  Lab 12/24/17 1153 12/24/17 1612 12/25/17 0605 12/26/17 0356  NA 136  --  135 138  K 3.4*  --  2.9* 4.0  CL 101  --  103 106  CO2 20*  --  20* 22  BUN 12  --  <5* <5*  CREATININE 0.90  --  0.59 0.54  CALCIUM 9.2  --  8.1* 8.6*  PROT  --  8.5*  --   --   BILITOT  --  0.6  --   --   ALKPHOS  --  80  --   --   ALT  --  17  --   --   AST  --  25  --   --   GLUCOSE 133*  --  117* 96   upreg neg Strep penumo uAG neg PT/INR WNL Lipase WNL  Imaging/Diagnostic Tests: No results found.  Lovena Neighbours, MD 12/26/2017, 11:11 AM PGY-2, [facility] FPTS Intern pager: [phone], text pages welcome

## 2017-12-27 LAB — BASIC METABOLIC PANEL
Anion gap: 12 (ref 5–15)
CO2: 24 mmol/L (ref 22–32)
CREATININE: 0.52 mg/dL (ref 0.44–1.00)
Calcium: 8.8 mg/dL — ABNORMAL LOW (ref 8.9–10.3)
Chloride: 103 mmol/L (ref 101–111)
Glucose, Bld: 105 mg/dL — ABNORMAL HIGH (ref 65–99)
Potassium: 3.1 mmol/L — ABNORMAL LOW (ref 3.5–5.1)
SODIUM: 139 mmol/L (ref 135–145)

## 2017-12-27 LAB — LEGIONELLA PNEUMOPHILA SEROGP 1 UR AG: L. pneumophila Serogp 1 Ur Ag: NEGATIVE

## 2017-12-27 LAB — CBC
HCT: 35.8 % — ABNORMAL LOW (ref 36.0–46.0)
Hemoglobin: 11.7 g/dL — ABNORMAL LOW (ref 12.0–15.0)
MCH: 27.5 pg (ref 26.0–34.0)
MCHC: 32.7 g/dL (ref 30.0–36.0)
MCV: 84 fL (ref 78.0–100.0)
PLATELETS: 225 10*3/uL (ref 150–400)
RBC: 4.26 MIL/uL (ref 3.87–5.11)
RDW: 14.4 % (ref 11.5–15.5)
WBC: 6.8 10*3/uL (ref 4.0–10.5)

## 2017-12-27 MED ORDER — AMOXICILLIN-POT CLAVULANATE 875-125 MG PO TABS: 1.0000 | ORAL_TABLET | Freq: Two times a day (BID) | ORAL | Status: DC

## 2017-12-27 MED ORDER — HYDROCHLOROTHIAZIDE 25 MG PO TABS
25.0000 mg | ORAL_TABLET | Freq: Every day | ORAL | Status: DC
  Administered 2017-12-27 – 2017-12-28 (×2): 25 mg via ORAL
  Filled 2017-12-27 (×2): qty 1

## 2017-12-27 MED ORDER — AZITHROMYCIN 500 MG PO TABS: 500.0000 mg | ORAL_TABLET | Freq: Every day | ORAL | Status: DC

## 2017-12-27 MED ORDER — AZITHROMYCIN 250 MG PO TABS
250.0000 mg | ORAL_TABLET | Freq: Every day | ORAL | Status: DC
  Administered 2017-12-27 – 2017-12-28 (×2): 250 mg via ORAL
  Filled 2017-12-27 (×2): qty 1

## 2017-12-27 MED ORDER — AMLODIPINE BESYLATE 5 MG PO TABS: 5.0000 mg | ORAL_TABLET | Freq: Every day | ORAL | Status: DC

## 2017-12-27 MED ORDER — AMLODIPINE BESYLATE 10 MG PO TABS
10.0000 mg | ORAL_TABLET | Freq: Every day | ORAL | Status: DC
  Administered 2017-12-27 – 2017-12-28 (×2): 10 mg via ORAL
  Filled 2017-12-27 (×2): qty 1

## 2017-12-27 NOTE — Progress Notes (Signed)
SATURATION QUALIFICATIONS: (This note is used to comply with regulatory documentation for home oxygen)  Patient Saturations on Room Air at Rest = 100%  Patient Saturations on Room Air while Ambulating = 100%  Patient Saturations on 2 Liters of oxygen while Ambulating = 100%

## 2017-12-27 NOTE — Progress Notes (Addendum)
Family Medicine Teaching Service Daily Progress Note Intern Pager: [phone]  Patient name: Sally Vaughan Medical record number: [id number] Date of birth: 06/10/1976 Age: 42 y.o. Gender: female  Primary Care Provider: No primary care provider on file. Consultants: None Code Status: Full  Pt Overview and Major Events to Date:  Admit 4/12 to FPTS  Assessment and Plan: Ninnie Samuelis a 42 y.o.femalepresenting with 3 days of chest pain and cough, found to have PNA. PMH is significant forHTN, Syphilis, alcohol use  Community Acquired Pneumonia, improving Right Lung PNA (all segments) on CTA. Patient continue to be afebrile .  and tachycardia has resolved. Still endorses chest wall/ pleuritic pain but improving. Cough is also improving. Blood cultures NGTD. Strep  pneumo negative. PE and dissection ruled out with CTA. No active concern for cardiac process, trop negative x3, EKG NSR.  --On IV Azithromycin and ceftriaxone (4/12-4/15 ) >>Transition to PO Azithromycin $/15 --Duoneb schedule q4hr --continue Prednisone 40 mg daily due to new O2 requirement over the weekend (improved) --Continue Telemetry --Continue tylenol, ibuprofen, tramadol for pain --Continue Albuterol PRN --Robitussin and tessalon perles for cough --Legionella pending >>does not appear to be atypical PNA on CXR or chest CT -- PT for increased mobilization, encourage OOB --Radiology recommendsFollowup PA and lateral chest X-ray in 3-4 weeks following trial of antibiotic therapy to ensure resolution and exclude underlying malignancy.  Tachycardia, resolved Likely acute in the setting of infection and albuterol treatment. TSH was within normal limits. Trop neg x3 . EKG NSR. Will continue to monitor. --Continue telemetry -- tylenol PRN fever, abx as above --Labetalol PRN HTN --Continue to monitor on CIWA as noted below  Abdominal Pain with Nausea  in the setting of established pneumonia. Most likely due to her acute  illness. LFT's, lipase WNL. Urine pregnancy negative.  Abdominal pain resolved with continued nausea --GI cocktail PRN --Phenergan PO q6h prn- avoiding zofran due to prolonged QTc --Monitor on CIWA  Epistaxis, resolved  Hemoglobin is stable. 6 episodes in the past few days . No history of bleeding disorders or family history. Has never had this before. Only associated with mild HA. Normal platelets, PT/INR. Hgb 11.8 today --Continue to monitor CBC  Hypertension BP this AM at 162/107. Has not been on regular BP medication.Pt says shepreviously tooklisinopril, but does not know dose.  -- Discontinue mIVF -- IV Labetalol 5 mg PRN for BP and tachycardia -- increase PO HCTZ to 25 mg --add norvasc 10 mg daily -- Monitor BP next 24 hr  H/o Syphilis with uveitis Patient reports h/o syphilis with treatment with IV antibiotics. She also reports having the complication of Uveitis. Says her vision is intact. On exam eyes appear icteric with mild blue sclera, no injection or erythema observed.   H/o of alcohol use - CIWA monitoring, no ativan prn -- Trending at 0 overnight  H/o smoking -nicotine patch -nurse to provide smoking cessation resources  Prolonged QTc- noted to be 562 on admission EKG - avoid QTc prolonging meds  FEN/GI:HHD, mIVF Prophylaxis:LMWH  Disposition: Home pending clinical improvement   Subjective:  No acute events overnight. Patient endorses continued central chest pain with radiation to lateral chest wall bilaterally, with improvement from admission. She has decreased cough, productive of yellow sputum. She required no supplemental oxygen overnight, but has intermittent shortness of breath, improved with Duoneb treatment and albuterol inhaler. She admits nausea, with no vomiting, diarrhea, or abdominal pain. No nosebleeds since admission. She denies subjective fevers overnight.  Objective: Temp:  [98 F (36.7  C)-98.9 F (37.2 C)] 98 F (36.7 C)  (04/15 0410) Pulse Rate:  [84-102] 92 (04/15 0410) Resp:  [18-25] 20 (04/15 0410) BP: (146-162)/(102-117) 162/107 (04/15 0410) SpO2:  [91 %-100 %] 100 % (04/15 0410) Physical Exam: General: NAD, well-appearing, cooperative Cardiovascular: RRR, no RMG Respiratory: No increased work of breathing, course breath sounds bilaterally Abdomen: Non-distended, non-tender to palpation Extremities: No edema or cyanosis  Laboratory: Recent Labs  Lab 12/24/17 1612 12/25/17 0605 12/26/17 0356  WBC 10.4 9.1 7.9  HGB 13.2 11.3* 11.8*  HCT 39.3 34.6* 36.0  PLT 214 177 194   Recent Labs  Lab 12/24/17 1153 12/24/17 1612 12/25/17 0605 12/26/17 0356  NA 136  --  135 138  K 3.4*  --  2.9* 4.0  CL 101  --  103 106  CO2 20*  --  20* 22  BUN 12  --  <5* <5*  CREATININE 0.90  --  0.59 0.54  CALCIUM 9.2  --  8.1* 8.6*  PROT  --  8.5*  --   --   BILITOT  --  0.6  --   --   ALKPHOS  --  80  --   --   ALT  --  17  --   --   AST  --  25  --   --   GLUCOSE 133*  --  117* 96     Imaging/Diagnostic Tests:   Eual Finesike, [place] E, Medical Student 12/27/2017, 7:17 AM [facility] FPTS Intern pager: [phone], text pages welcome  I have separately seen and examined the patient. I have discussed the findings and exam with the medical student and I helped to edit and write  the above note. My physical exam is documented below:  GEN: well-appearing, NAD RESPIRATORY: +coarse breath sounds appreciated throughout with dec breath sounds at bil bases, no wheezes CV: RRR, no m/r/g, no peripheral edema GI: Soft, non-tender, non-distended, normoactive bowel sounds, no hepatosplenomegaly SKIN: warm and dry, no rashes or lesions NEURO: II-XII CN intact PSYCH: AAOx3, appropriate affect   Howard PouchLauren Axxel Gude, MD PGY-2 [facility]

## 2017-12-27 NOTE — Evaluation (Signed)
Physical Therapy Evaluation Patient Details Name: Sally Vaughan MRN: [id number] DOB: 09/06/1976 Today's Date: 12/27/2017   History of Present Illness  Pt is a 42 y.o. female admitted 12/24/17 with worsening chest pain and cough; found to have PNA. PMH includes HTN, syphilis, alcohol use.     Clinical Impression  Patient evaluated by Physical Therapy with no further acute PT needs identified. PTA, pt indep and lives with parents. Today, pt indep with all mobility. Pt asymptomatic, but BP increased post-ambulation (RN notified; see values below). Encouraged continued ambulation during hospital admission; educ on IS use. All education has been completed and the patient has no further questions. PT is signing off. Thank you for this referral.  Resting BP 147/104 Post-amb BP 161/118 (MAP 130)    Follow Up Recommendations No PT follow up    Equipment Recommendations  None recommended by PT    Recommendations for Other Services       Precautions / Restrictions Precautions Precautions: None Restrictions Weight Bearing Restrictions: No      Mobility  Bed Mobility Overal bed mobility: Independent                Transfers Overall transfer level: Independent                  Ambulation/Gait Ambulation/Gait assistance: Independent Ambulation Distance (Feet): 400 Feet Assistive device: None Gait Pattern/deviations: Step-through pattern;Decreased stride length Gait velocity: Decreased   General Gait Details: Slow, steady amb indep  Stairs Stairs: Yes Stairs assistance: Modified independent (Device/Increase time) Stair Management: One rail Right;Forwards Number of Stairs: 6 General stair comments: Simulated ascending steps by high knee marching with R-rail support; pt mod indep with this  Wheelchair Mobility    Modified Rankin (Stroke Patients Only)       Balance Overall balance assessment: No apparent balance deficits (not formally assessed)                                           Pertinent Vitals/Pain Pain Assessment: No/denies pain    Home Living Family/patient expects to be discharged to:: Private residence Living Arrangements: Parent Available Help at Discharge: Family;Available PRN/intermittently Type of Home: House Home Access: Stairs to enter Entrance Stairs-Rails: Doctor, general practice of Steps: 6 Home Layout: One level Home Equipment: None Additional Comments: Lives with parents    Prior Function Level of Independence: Independent         Comments: Does not work. Mainly sedentary, enjoys watching TV     Hand Dominance        Extremity/Trunk Assessment   Upper Extremity Assessment Upper Extremity Assessment: Overall WFL for tasks assessed    Lower Extremity Assessment Lower Extremity Assessment: Overall WFL for tasks assessed    Cervical / Trunk Assessment Cervical / Trunk Assessment: Normal  Communication   Communication: No difficulties  Cognition Arousal/Alertness: Awake/alert Behavior During Therapy: WFL for tasks assessed/performed Overall Cognitive Status: Within Functional Limits for tasks assessed                                        General Comments      Exercises     Assessment/Plan    PT Assessment Patent does not need any further PT services  PT Problem List  PT Treatment Interventions      PT Goals (Current goals can be found in the Care Plan section)  Acute Rehab PT Goals PT Goal Formulation: All assessment and education complete, DC therapy    Frequency     Barriers to discharge        Co-evaluation               AM-PAC PT "6 Clicks" Daily Activity  Outcome Measure Difficulty turning over in bed (including adjusting bedclothes, sheets and blankets)?: None Difficulty moving from lying on back to sitting on the side of the bed? : None Difficulty sitting down on and standing up from a chair with arms (e.g.,  wheelchair, bedside commode, etc,.)?: None Help needed moving to and from a bed to chair (including a wheelchair)?: None Help needed walking in hospital room?: None Help needed climbing 3-5 steps with a railing? : None 6 Click Score: 24    End of Session Equipment Utilized During Treatment: Gait belt Activity Tolerance: Patient tolerated treatment well Patient left: in bed;with call bell/phone within reach;Other (comment)(with RT present) Nurse Communication: Mobility status;Other (comment)(BP value) PT Visit Diagnosis: Other abnormalities of gait and mobility (R26.89)    Time: 8119-14781512-1528 PT Time Calculation (min) (ACUTE ONLY): 16 min   Charges:   PT Evaluation $PT Eval Low Complexity: 1 Low     PT G Codes:       Ina HomesJaclyn Regginald Pask, PT, DPT Acute Rehab Services  Pager: [phone]  Malachy ChamberJaclyn L Jermayne Sweeney 12/27/2017, 3:37 PM

## 2017-12-28 LAB — CBC
HCT: 37 % (ref 36.0–46.0)
HEMOGLOBIN: 12.3 g/dL (ref 12.0–15.0)
MCH: 27.3 pg (ref 26.0–34.0)
MCHC: 33.2 g/dL (ref 30.0–36.0)
MCV: 82.2 fL (ref 78.0–100.0)
PLATELETS: 249 10*3/uL (ref 150–400)
RBC: 4.5 MIL/uL (ref 3.87–5.11)
RDW: 14 % (ref 11.5–15.5)
WBC: 6.2 10*3/uL (ref 4.0–10.5)

## 2017-12-28 LAB — BASIC METABOLIC PANEL
Anion gap: 12 (ref 5–15)
BUN: 7 mg/dL (ref 6–20)
CALCIUM: 9.3 mg/dL (ref 8.9–10.3)
CO2: 25 mmol/L (ref 22–32)
CREATININE: 0.56 mg/dL (ref 0.44–1.00)
Chloride: 102 mmol/L (ref 101–111)
Glucose, Bld: 98 mg/dL (ref 65–99)
Potassium: 3.1 mmol/L — ABNORMAL LOW (ref 3.5–5.1)
SODIUM: 139 mmol/L (ref 135–145)

## 2017-12-28 MED ORDER — POTASSIUM CHLORIDE CRYS ER 20 MEQ PO TBCR
40.0000 meq | EXTENDED_RELEASE_TABLET | Freq: Two times a day (BID) | ORAL | Status: DC
  Administered 2017-12-28: 40 meq via ORAL
  Filled 2017-12-28: qty 2

## 2017-12-28 MED ORDER — PREDNISONE 20 MG PO TABS
40.0000 mg | ORAL_TABLET | Freq: Every day | ORAL | 0 refills | Status: AC
Start: 2017-12-29 — End: ?

## 2017-12-28 MED ORDER — AMLODIPINE BESYLATE 10 MG PO TABS
10.0000 mg | ORAL_TABLET | Freq: Every day | ORAL | 0 refills | Status: AC
Start: 2017-12-28 — End: ?

## 2017-12-28 MED ORDER — BENZONATATE 100 MG PO CAPS: 100.0000 mg | ORAL_CAPSULE | Freq: Two times a day (BID) | ORAL | 0 refills | Status: AC | PRN

## 2017-12-28 MED ORDER — GUAIFENESIN-DM 100-10 MG/5ML PO SYRP: 5.0000 mL | ORAL_SOLUTION | ORAL | 0 refills | Status: AC | PRN

## 2017-12-28 MED ORDER — HYDROCHLOROTHIAZIDE 25 MG PO TABS: 25.0000 mg | ORAL_TABLET | Freq: Every day | ORAL | 0 refills | Status: AC

## 2017-12-28 MED ORDER — AZITHROMYCIN 250 MG PO TABS: 250.0000 mg | ORAL_TABLET | Freq: Every day | ORAL | 0 refills | Status: AC

## 2017-12-28 NOTE — Progress Notes (Signed)
Family Medicine Teaching Service Daily Progress Note Intern Pager: [phone]  Patient name: Dalene CarrowLateal Kroeze Medical record number: [id number] Date of birth: 04/16/1976 Age: 42 y.o. Gender: female  Primary Care Provider: No primary care provider on file. Consultants: None  Code Status: full  Pt Overview and Major Events to Date:  Admit 4/12 to FPTS  Assessment and Plan: Judit Samuelis a 42 y.o.femalepresenting with 3 days of chest pain and cough, found to have PNA. PMH is significant forHTN, Syphilis, alcohol use  Community Acquired Pneumonia, improving Patient did well on oral antibiotics overnight. She remains afebrile and is considered stable for discharge. Pain has improved and she continues ot be on room air. --On IV Azithromycin and ceftriaxone (4/12-4/15 ) >>Transition to PO Azithromycin (4/15-4/18) --Duoneb  q4hr --continue Prednisone 40 mg daily (4/14-4/18) --Continue Albuterol PRN --Robitussin and tessalon perles for cough --Radiology recommendsFollowup PA and lateral chest X-ray in 3-4 weeks following trial of antibiotic therapy to ensure resolution and exclude underlying malignancy.  Hypokalemia 3.1 on AM labs. -- Replete KCL 40 meq x2  Abdominal Pain with Nausea  in the setting of established pneumonia. Most likely due to her acute illness. LFT's, lipase WNL.Urine pregnancy negative.  Abdominal pain resolved with continued nausea --GI cocktail PRN --Phenergan PO q6h prn- avoiding zofran due to prolonged QTc --Monitor on CIWA  Epistaxis, resolved  Hemoglobin is stable.6 episodesin the past few days. No history of bleeding disorders or family history. Has never had this before. Only associated with mild HA. Normal platelets, PT/INR. Hgb 12.3 today --Continue tomonitor CBC  Hypertension BP this AM at 137/99. Has not been on regular BP medication.Pt says shepreviously tooklisinopril, but does not know dose. -- Discharge on norvasc 10 mg, HCTZ 25 mg  -  advised patient to follow up with PCP for further titration of antihypertensives outpatient as needed  H/o Syphilis with uveitis Patient reports h/o syphilis with treatment with IV antibiotics. She also reports having the complication of Uveitis. Says her vision is intact. On exam eyes appear icteric with mild blue sclera, no injection or erythema observed.   H/o of alcohol use -- CIWA monitoring, no ativan prn -- Trending at 0 overnight  H/o smoking -nicotine patch -nurse to provide smoking cessation resources  Prolonged QTc- noted to be 562 on admission EKG - avoid QTc prolonging meds  FEN/GI: Heart healthy diet PPx: LMWH  Disposition: Discharge home today pending clinical improvement  Subjective:  Patien tdid well overnight with no acute events. Chest pain, dyspnea improved. Ambulated yesterday with PT and did well. She is agreeable to discharge today.  Objective: Temp:  [97.6 F (36.4 C)] 97.6 F (36.4 C) (04/16 0829) Pulse Rate:  [78-96] 92 (04/16 0900) Resp:  [15-24] 18 (04/16 0900) BP: (137-139)/(99-104) 137/99 (04/16 0829) SpO2:  [97 %-100 %] 97 % (04/16 0900) Physical Exam: General: NAD, comfortable, pleasant Cardiovascular: RRR, no m/r/g Respiratory: +diffuse coarse breath sounds, moving air through all lung fields, comfortable work of breathing Abdomen: soft, nt, nd Extremities: no LE edema  Laboratory: Recent Labs  Lab 12/26/17 0356 12/27/17 0933 12/28/17 0540  WBC 7.9 6.8 6.2  HGB 11.8* 11.7* 12.3  HCT 36.0 35.8* 37.0  PLT 194 225 249   Recent Labs  Lab 12/24/17 1612  12/26/17 0356 12/27/17 0933 12/28/17 0540  NA  --    < > 138 139 139  K  --    < > 4.0 3.1* 3.1*  CL  --    < > 106 103 102  CO2  --    < > 22 24 25   BUN  --    < > <5* <5* 7  CREATININE  --    < > 0.54 0.52 0.56  CALCIUM  --    < > 8.6* 8.8* 9.3  PROT 8.5*  --   --   --   --   BILITOT 0.6  --   --   --   --   ALKPHOS 80  --   --   --   --   ALT 17  --   --   --   --    AST 25  --   --   --   --   GLUCOSE  --    < > 96 105* 98   < > = values in this interval not displayed.     Imaging/Diagnostic Tests: No results found.   Howard Pouch, MD PGY-2 [facility]

## 2017-12-28 NOTE — Discharge Summary (Signed)
[facility]  Patient name: Sally Vaughan Medical record number: [id number] Date of birth: 07/19/1976 Age: 42 y.o. Gender: female Date of Admission: 12/24/2017  Date of Discharge: 12/28/2017 Admitting Physician: Latrelle DodrillBrittany J McIntyre, MD  Primary Care Provider: No primary care provider on file. Consultants: None  Indication for Hospitalization: Pneumonia  Discharge Diagnoses/Problem List:  Patient Active Problem List   Diagnosis Date Noted  . Community acquired pneumonia of right lower lobe of lung (HCC) 12/24/2017  . Chest pain 12/24/2017     Disposition: Home  Discharge Condition: Stable/Improved  Discharge Exam:  General: NAD, comfortable, pleasant Cardiovascular: RRR, no m/r/g Respiratory: +diffuse coarse breath sounds, moving air through all lung fields, comfortable work of breathing Abdomen: soft, nt, nd Extremities: no LE edema  Brief Hospital Course:  Patient was admitted to the hospital for dyspnea and chest pain, found to have RUL/RML/RLL PNA diagnosed on CTA. She was ruled out for PE, aortic dissection and ACS on admission due to significant chest pain. PNA on CTA was more impressive than on CXR.   Patient was initially treated with IV antibiotics and transitioned to PO when clinically improved. Due to dyspnea as well as transient oxygen requirement, prednisone was also initiated and she received duonebs, albuterol in the hospital which she found improved her symptoms.  Robitussin and tessalon were given due to pain with coughing.  Issues for Follow Up:  1. Patient discharged on antibiotics with azithromycin (last dose 4/18) and prednisone (last dose 4/18). 2. Patient was started on multiple antihypertensives this admission including norvasc 10 mg daily, HCTZ 25 mg daily. Please follow up blood pressure.  Significant Procedures: None  Significant Labs and Imaging:  Recent Labs  Lab 12/26/17 0356 12/27/17 0933  12/28/17 0540  WBC 7.9 6.8 6.2  HGB 11.8* 11.7* 12.3  HCT 36.0 35.8* 37.0  PLT 194 225 249   Recent Labs  Lab 12/24/17 1153 12/24/17 1612 12/25/17 0605 12/26/17 0356 12/27/17 0933 12/28/17 0540  NA 136  --  135 138 139 139  K 3.4*  --  2.9* 4.0 3.1* 3.1*  CL 101  --  103 106 103 102  CO2 20*  --  20* 22 24 25   GLUCOSE 133*  --  117* 96 105* 98  BUN 12  --  <5* <5* <5* 7  CREATININE 0.90  --  0.59 0.54 0.52 0.56  CALCIUM 9.2  --  8.1* 8.6* 8.8* 9.3  ALKPHOS  --  80  --   --   --   --   AST  --  25  --   --   --   --   ALT  --  17  --   --   --   --   ALBUMIN  --  3.5  --   --   --   --     Results/Tests Pending at Time of Discharge: None  Discharge Medications:  Allergies as of 12/28/2017   No Known Allergies     Medication List    TAKE these medications   acetaminophen 500 MG tablet Commonly known as:  TYLENOL Take 500-1,000 mg by mouth every 6 (six) hours as needed (for fever, pain, and headaches).   amLODipine 10 MG tablet Commonly known as:  NORVASC Take 1 tablet (10 mg total) by mouth daily.   azithromycin 250 MG tablet Commonly known as:  ZITHROMAX Take 1 tablet (250 mg total) by mouth daily for 2 days. Start taking  on:  12/29/2017   benzonatate 100 MG capsule Commonly known as:  TESSALON Take 1 capsule (100 mg total) by mouth 2 (two) times daily as needed for cough.   guaiFENesin-dextromethorphan 100-10 MG/5ML syrup Commonly known as:  ROBITUSSIN DM Take 5 mLs by mouth every 4 (four) hours as needed for cough.   hydrochlorothiazide 25 MG tablet Commonly known as:  HYDRODIURIL Take 1 tablet (25 mg total) by mouth daily.   predniSONE 20 MG tablet Commonly known as:  DELTASONE Take 2 tablets (40 mg total) by mouth daily with breakfast. Start taking on:  12/29/2017       Discharge Instructions: Please refer to Patient Instructions section of EMR for full details.  Patient was counseled important signs and symptoms that should prompt return to  medical care, changes in medications, dietary instructions, activity restrictions, and follow up appointments.   Follow-Up Appointments:  Advised to follow up with PCP in one week.   Howard Pouch, MD 12/28/2017, 11:44 AM PGY-2, [facility]

## 2017-12-28 NOTE — Care Management Note (Signed)
Case Management Note Donn PieriniKristi Monay Houlton RN, BSN Unit 4E-Case Manager [phone]  Patient Details  Name: Sally CarrowLateal Mcmanamon MRN: [id number] Date of Birth: 10/24/1975  Subjective/Objective: Pt admitted with CAP                 Action/Plan: PTA pt lived at home- independent- no CM needs noted for transition home  Expected Discharge Date:  12/28/17               Expected Discharge Plan:  Home/Self Care  In-House Referral:  NA  Discharge planning Services  CM Consult  Post Acute Care Choice:  NA Choice offered to:  NA  DME Arranged:    DME Agency:     HH Arranged:    HH Agency:     Status of Service:  Completed, signed off  If discussed at Long Length of Stay Meetings, dates discussed:    Discharge Disposition: home/self care   Additional Comments:  Darrold SpanWebster, Juandavid Dallman Hall, RN 12/28/2017, 12:14 PM

## 2017-12-28 NOTE — Discharge Instructions (Signed)
°  You were treated in the hospital for pneumonia. You will be discharged on 2 more days of antibiotics as well as steroids. Please schedule a visit to see your regular doctor within the next week.  You were also discharged with new blood pressure medications.  Community-Acquired Pneumonia, Adult Pneumonia is an infection of the lungs. One type of pneumonia can happen while a person is in a hospital. A different type can happen when a person is not in a hospital (community-acquired pneumonia). It is easy for this kind to spread from person to person. It can spread to you if you breathe near an infected person who coughs or sneezes. Some symptoms include:  A dry cough.  A wet (productive) cough.  Fever.  Sweating.  Chest pain.  Follow these instructions at home:  Take over-the-counter and prescription medicines only as told by your doctor. ? Only take cough medicine if you are losing sleep. ? If you were prescribed an antibiotic medicine, take it as told by your doctor. Do not stop taking the antibiotic even if you start to feel better.  Sleep with your head and neck raised (elevated). You can do this by putting a few pillows under your head, or you can sleep in a recliner.  Do not use tobacco products. These include cigarettes, chewing tobacco, and e-cigarettes. If you need help quitting, ask your doctor.  Drink enough water to keep your pee (urine) clear or pale yellow. A shot (vaccine) can help prevent pneumonia. Shots are often suggested for:  People older than 41 years of age.  People older than 41 years of age: ? Who are having cancer treatment. ? Who have long-term (chronic) lung disease. ? Who have problems with their body's defense system (immune system).  You may also prevent pneumonia if you take these actions:  Get the flu (influenza) shot every year.  Go to the dentist as often as told.  Wash your hands often. If soap and water are not available, use hand  sanitizer.  Contact a doctor if:  You have a fever.  You lose sleep because your cough medicine does not help. Get help right away if:  You are short of breath and it gets worse.  You have more chest pain.  Your sickness gets worse. This is very serious if: ? You are an older adult. ? Your body's defense system is weak.  You cough up blood. This information is not intended to replace advice given to you by your health care provider. Make sure you discuss any questions you have with your health care provider. Document Released: 02/17/2008 Document Revised: 02/06/2016 Document Reviewed: 12/26/2014 Elsevier Interactive Patient Education  [employer]2018 [employer]

## 2017-12-28 NOTE — Progress Notes (Signed)
Discharge instructions reviewed with patient. Patient verbalized understanding. IV and telemetry discontinued. CCMD notified.   Ernestina ColumbiaK. Starr [place] Hanlon, RN

## 2017-12-29 LAB — CULTURE, BLOOD (ROUTINE X 2)
CULTURE: NO GROWTH
Culture: NO GROWTH
SPECIAL REQUESTS: ADEQUATE

## 2018-05-24 IMAGING — CT CT ANGIO CHEST
2 of 11 series · 18 of 46 positions shown · IV contrast (OMNI)
Comparison: Chest radiograph December 24, 2017

CLINICAL DATA: Three-day history of chest pain

EXAM:
CT ANGIOGRAPHY CHEST WITH CONTRAST
TECHNIQUE: Multidetector CT imaging of the chest was performed using the
standard protocol during bolus administration of intravenous
contrast. Multiplanar CT image reconstructions and MIPs were
obtained to evaluate the vascular anatomy.
CONTRAST:  55 mL WV49VP-STQ IOPAMIDOL (WV49VP-STQ) INJECTION 76%

[Series 8: thins · axial · 0.59mm/px · z∈[+1106,+1338]mm · 15 of 257 slices shown]
[im 13/257  lung]
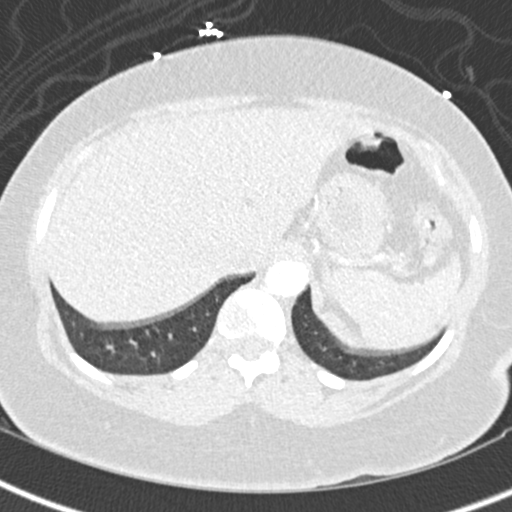
[im 26/257  soft-tissue]
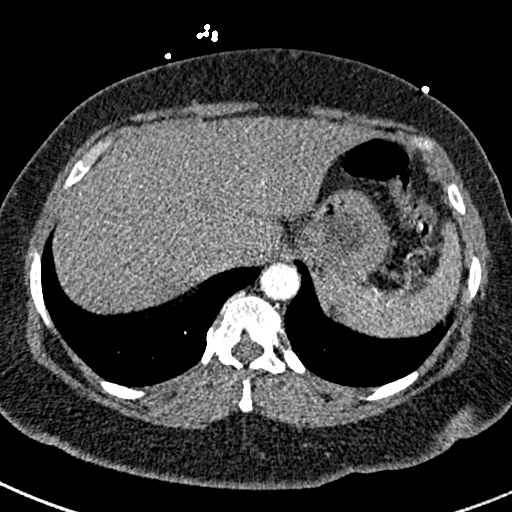
[im 52/257  lung]
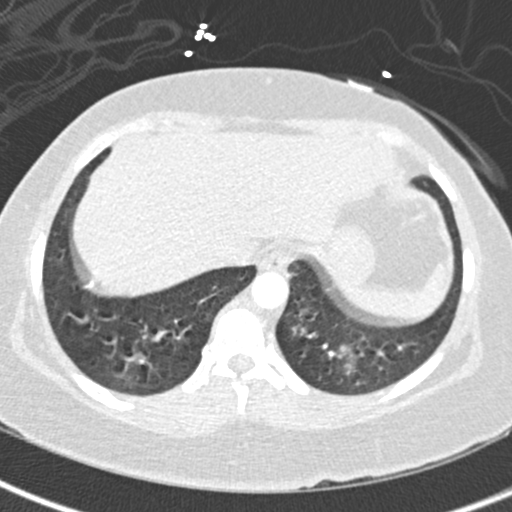
[im 65/257  soft-tissue]
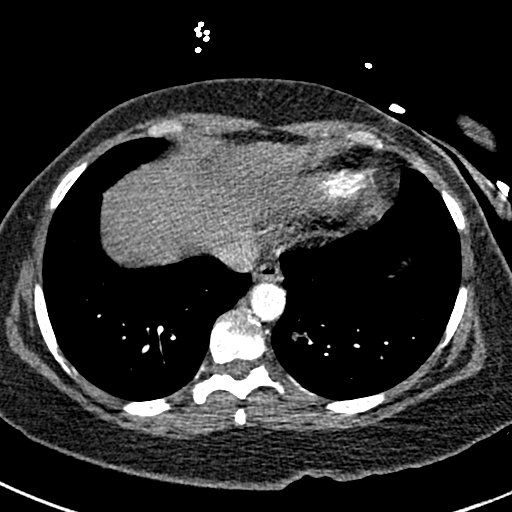
[im 77/257  lung]
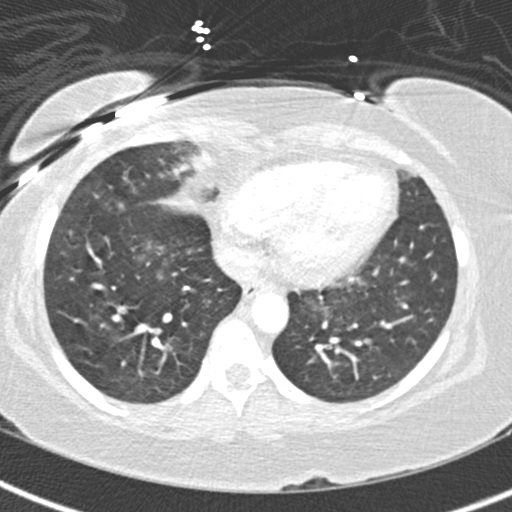
[im 90/257  soft-tissue]
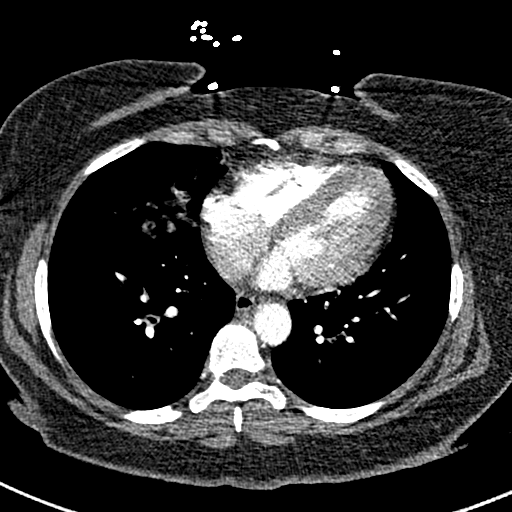
[im 116/257  lung]
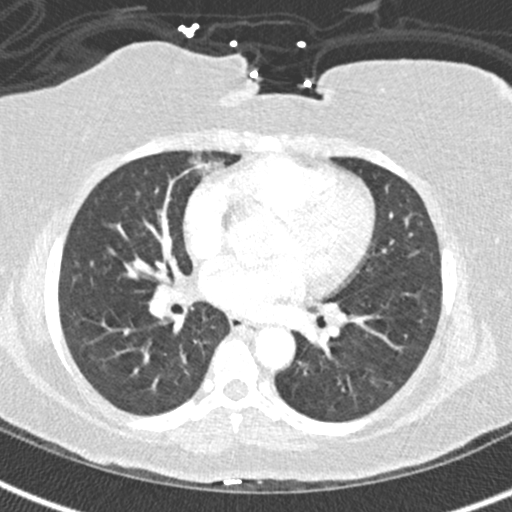
[im 129/257  soft-tissue]
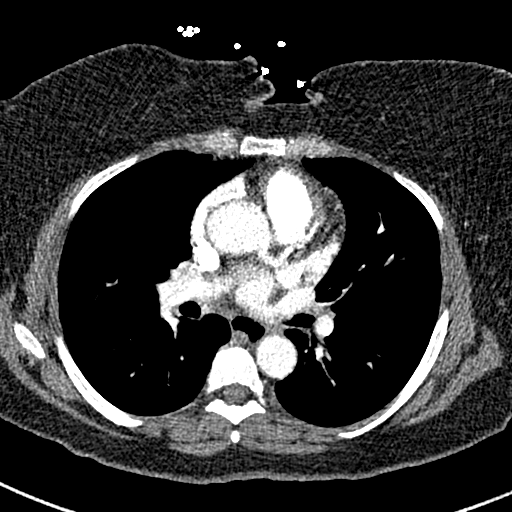
[im 141/257  lung]
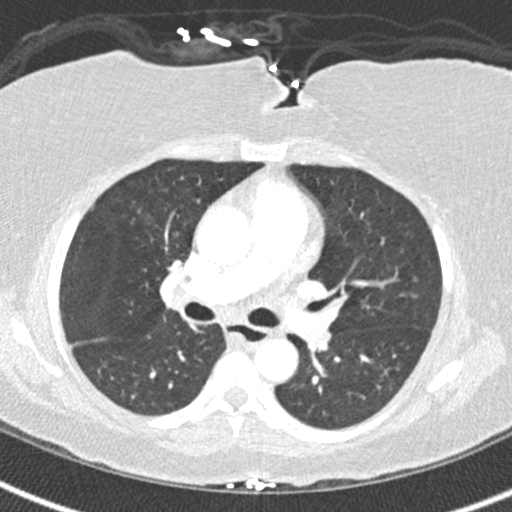
[im 167/257  soft-tissue]
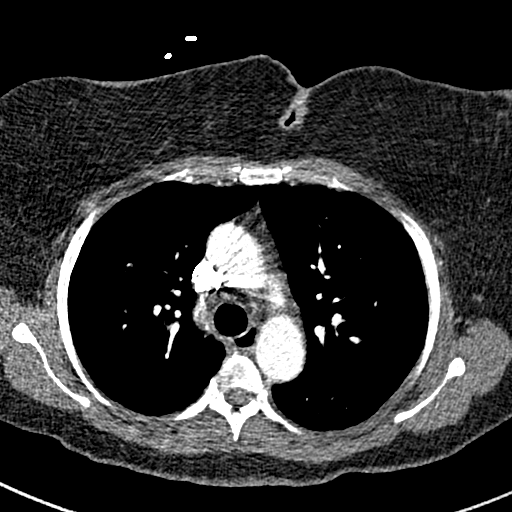
[im 180/257  lung]
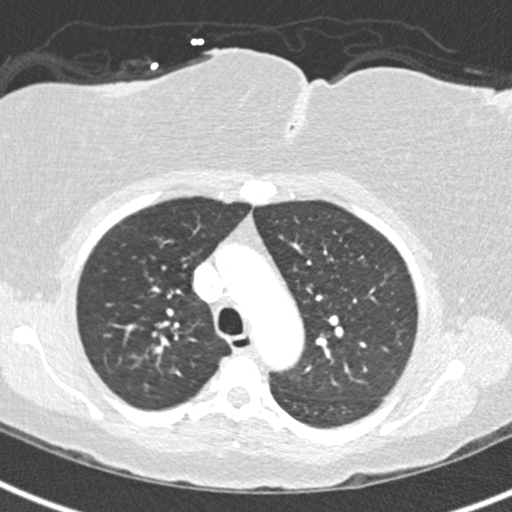
[im 193/257  soft-tissue]
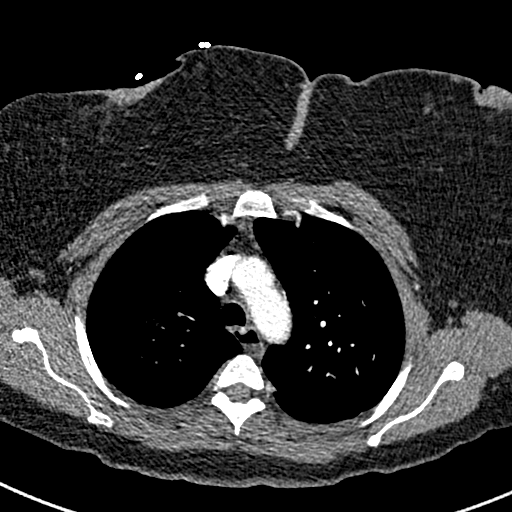
[im 205/257  lung]
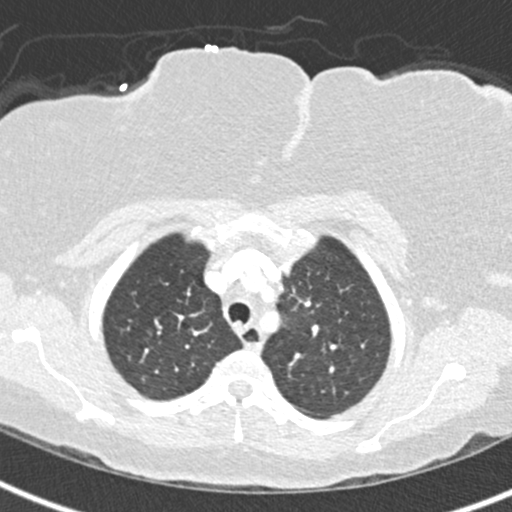
[im 231/257  soft-tissue]
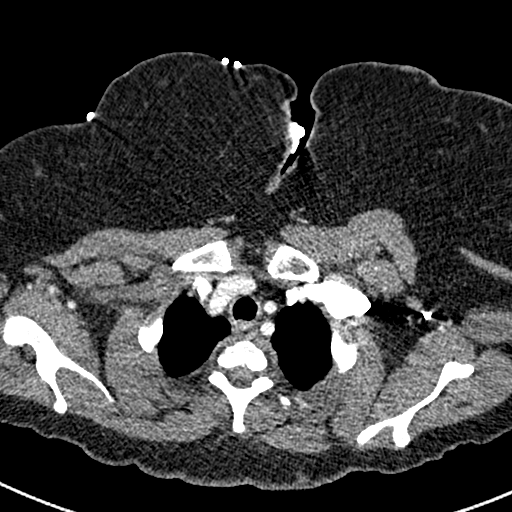
[im 244/257  lung]
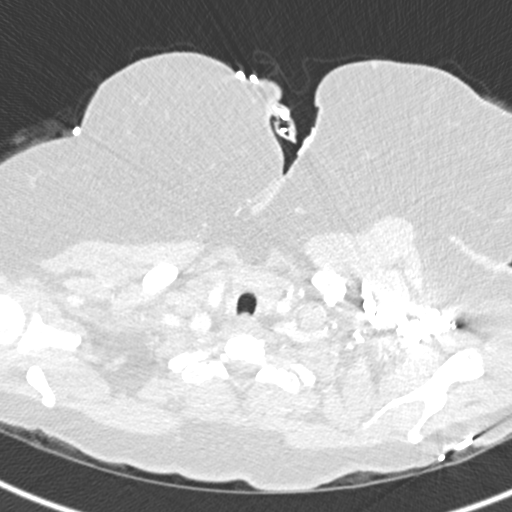

[Series 10: coronal mpr · coronal · 0.52mm/px · 3 of 125 slices shown]
[im 32/125  soft-tissue]
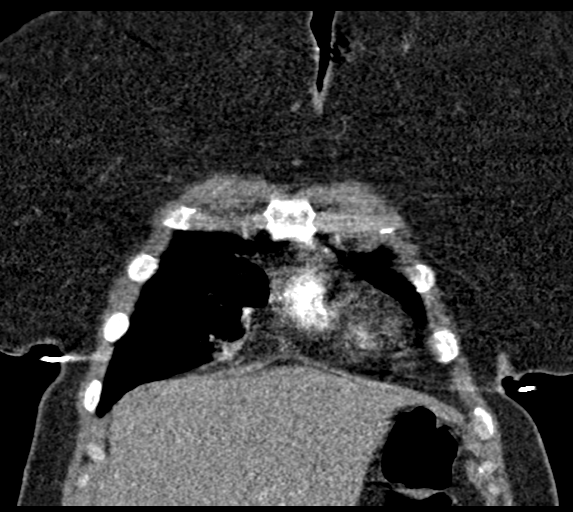
[im 63/125  soft-tissue]
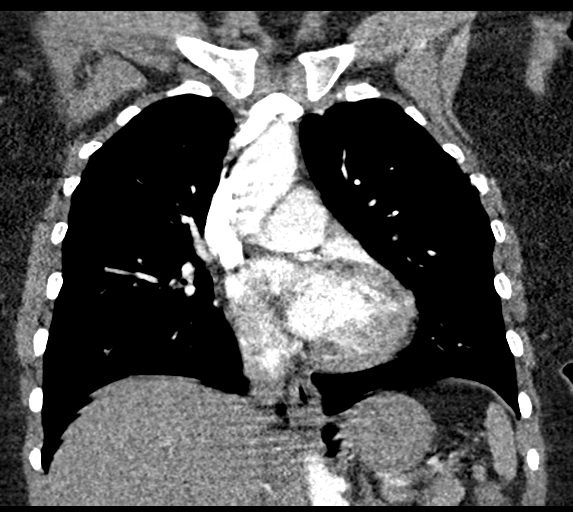
[im 94/125  soft-tissue]
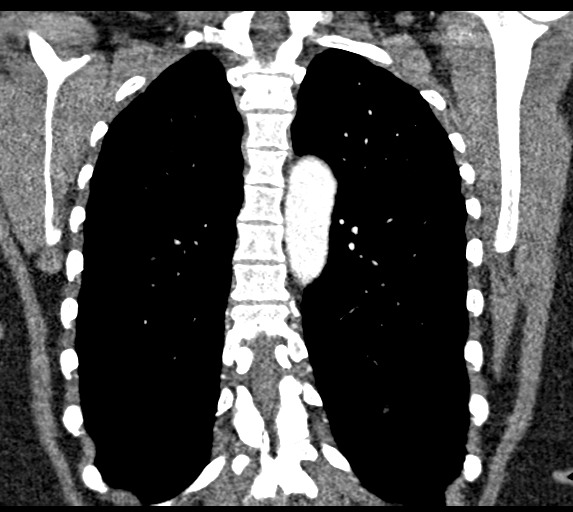

[18 of 46 positions shown; findings below may reference images not displayed]

FINDINGS: Cardiovascular: There is no demonstrable pulmonary embolus. There is
no thoracic aortic aneurysm or dissection. The visualized great
vessels appear unremarkable. There is no appreciable pericardial
effusion or pericardial thickening.

Mediastinum/Nodes: Visualized thyroid appears normal. There is a
lymph node anterior to the carina measuring 1.4 x 1.0 cm. There is a
subcarinal lymph node measuring 1.2 x 1.0 cm. There are scattered
subcentimeter lymph nodes as well in the thorax. No esophageal
lesions are appreciable.

Lungs/Pleura: There is tree on Choc type appearance in the right
upper lobe involving portions of the anterior and posterior segments
of the right upper lobe consistent with pneumonia. There is patchy
consolidation in the right middle lobe. There is also patchy
infiltrate in the superior and posterior segments of the left lower
lobe. No pleural effusion or pleural thickening evident.

Upper Abdomen: Visualized upper abdominal structures appear
unremarkable.

Musculoskeletal: No blastic or lytic bone lesions. There is upper
lumbar levoscoliosis. No appreciable chest wall lesions.

Review of the MIP images confirms the above findings.
IMPRESSION: 1. Multifocal pneumonia with areas of pneumonia in the right upper
lobe, right middle lobe, and left lower lobe.

2. No demonstrable pulmonary embolus. No thoracic aortic aneurysm or
dissection.

3. Mild adenopathy, likely reactive given the changes in the lung
parenchyma.

## 2018-05-24 IMAGING — CR DG CHEST 2V
2 series · 2 of 2 positions shown · non-contrast
Comparison: No prior.

CLINICAL DATA: Shortness of breath.  Shortness of breath.

EXAM:
CHEST - 2 VIEW

[chest pa]
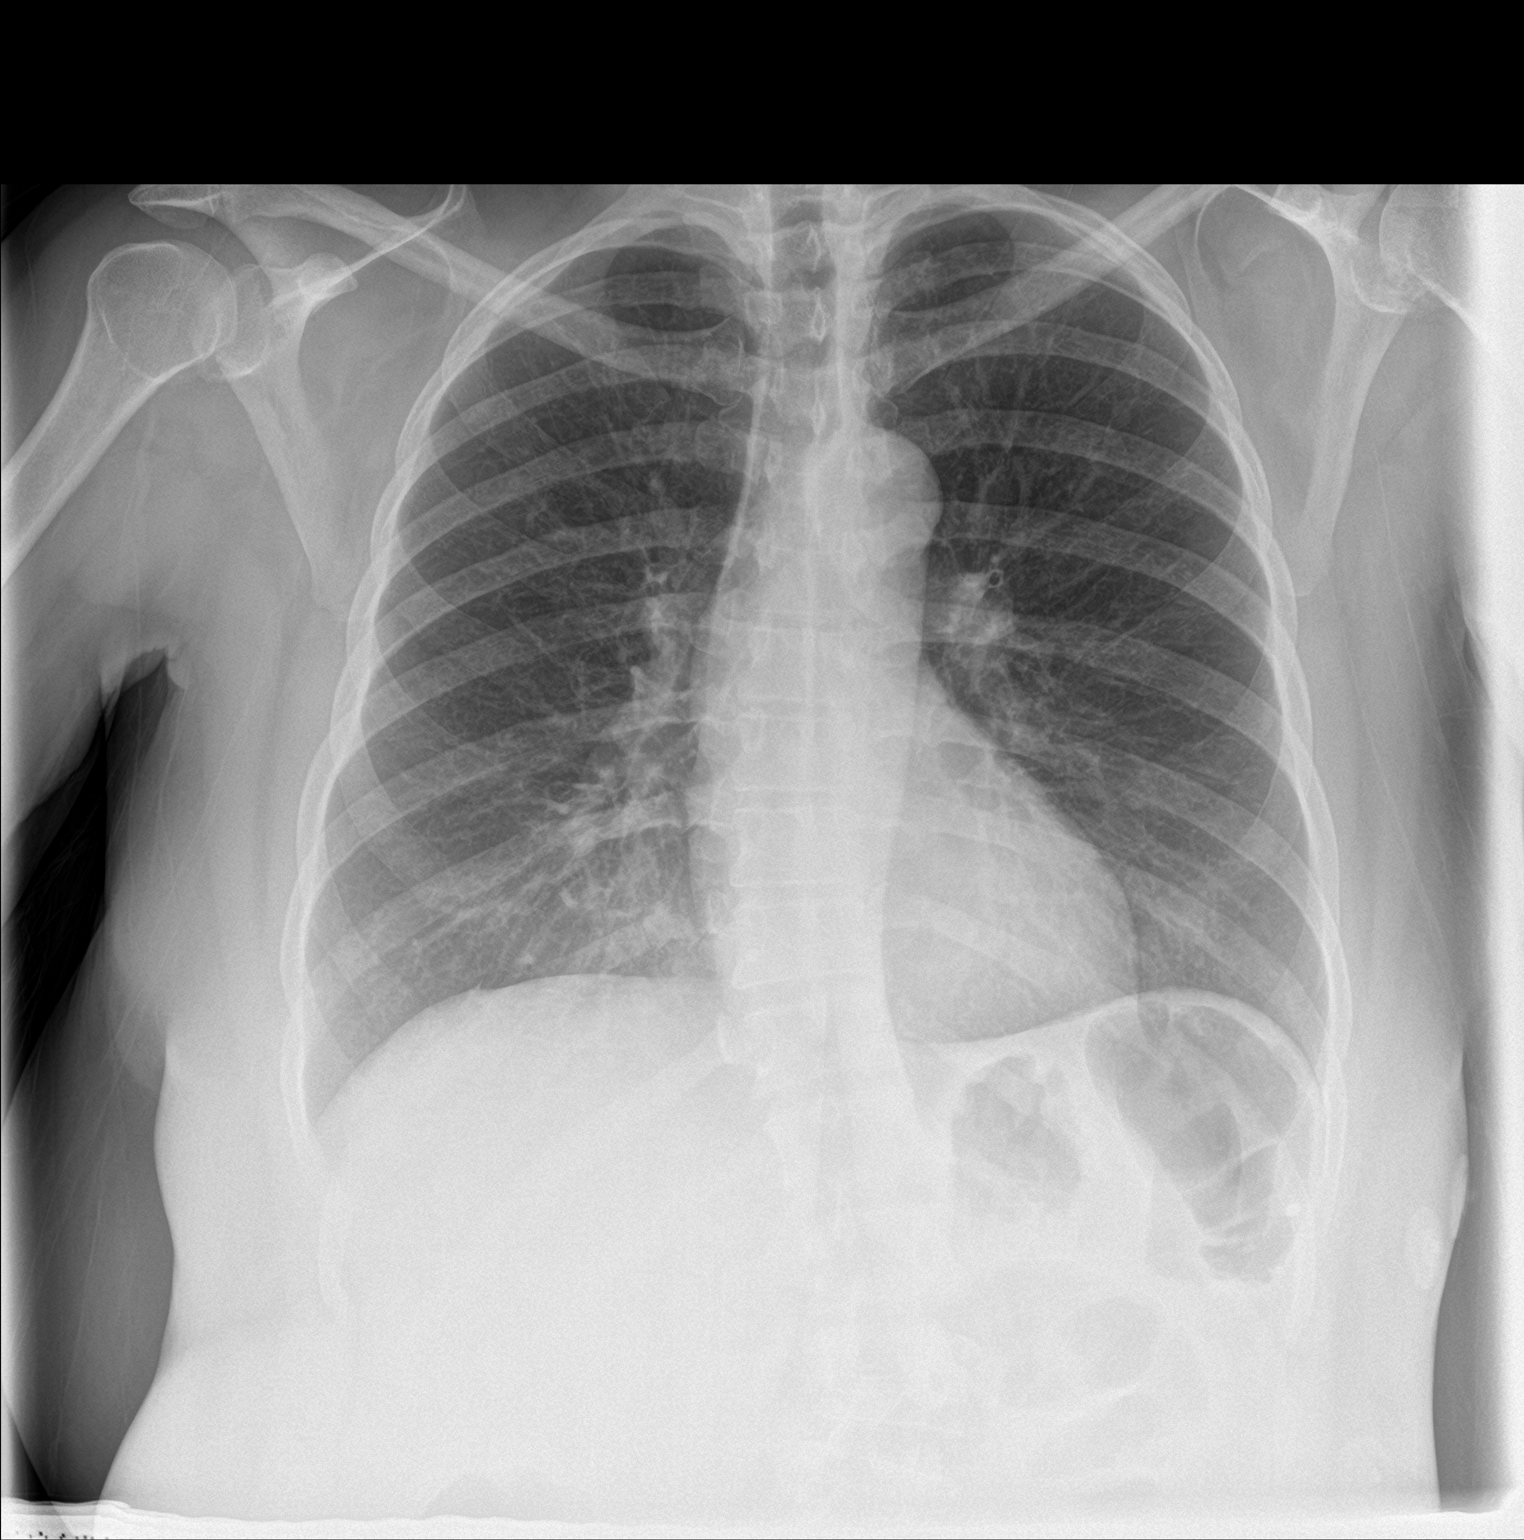

[chest lat]
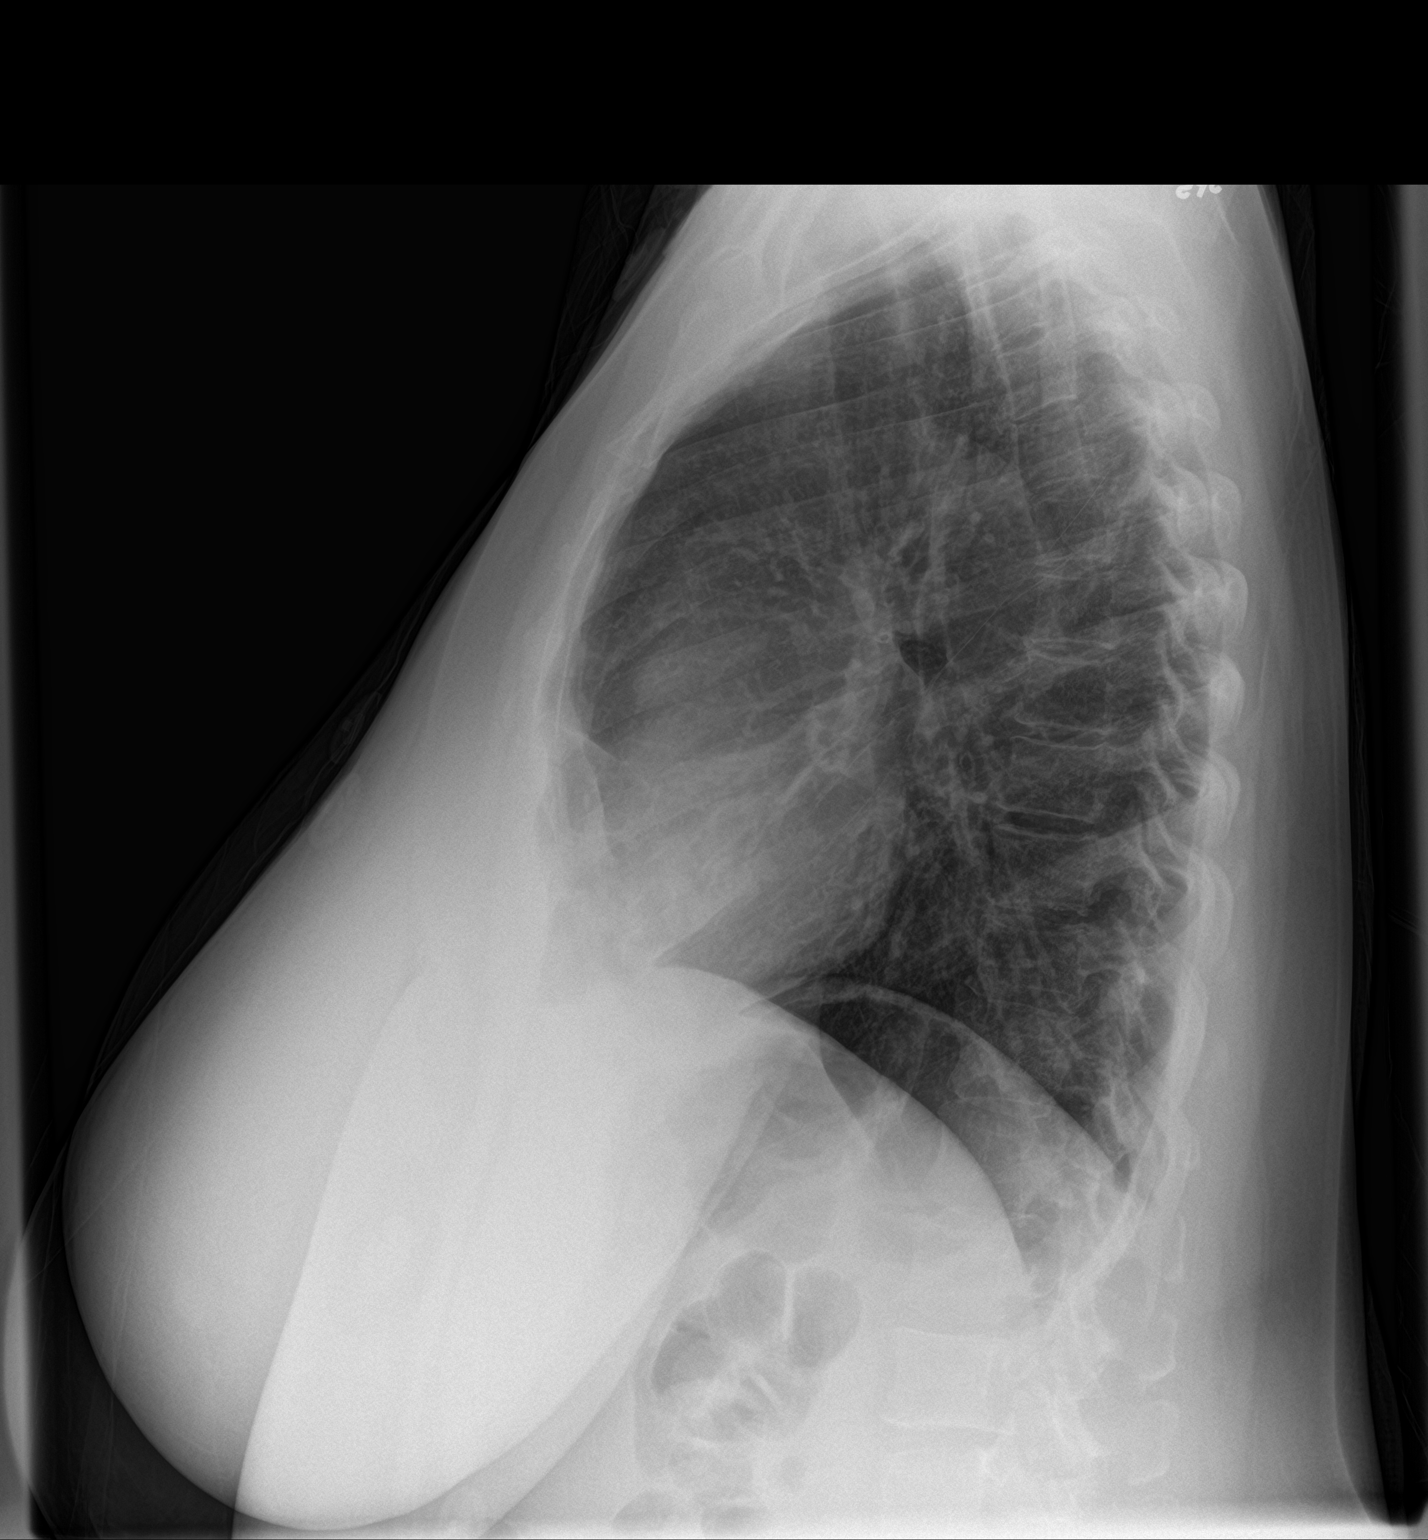

[2 of 2 positions shown; findings below may reference images not displayed]

FINDINGS: Mediastinum hilar structures normal. Mild right middle lobe
infiltrate suggesting pneumonia. No pleural effusion or
pneumothorax. Heart size normal. Thoracic spine scoliosis.
IMPRESSION: Mild right middle lobe infiltrate consistent pneumonia. Followup PA
and lateral chest X-ray is recommended in 3-4 weeks following trial
of antibiotic therapy to ensure resolution and exclude underlying
malignancy.
